# Patient Record
Sex: Female | Born: 1970 | Race: White | Hispanic: No | Marital: Married | Smoking: Never smoker
Health system: Southern US, Community
[De-identification: ages and names within clinical notes are randomized; demographics above are authoritative.]

## PROBLEM LIST (undated history)

## (undated) DIAGNOSIS — N841 Polyp of cervix uteri: Secondary | ICD-10-CM

## (undated) DIAGNOSIS — B019 Varicella without complication: Secondary | ICD-10-CM

## (undated) DIAGNOSIS — F419 Anxiety disorder, unspecified: Secondary | ICD-10-CM

## (undated) DIAGNOSIS — F32A Depression, unspecified: Secondary | ICD-10-CM

## (undated) HISTORY — DX: Depression, unspecified: F32.A

## (undated) HISTORY — PX: NASAL SEPTUM SURGERY: SHX37

## (undated) HISTORY — DX: Polyp of cervix uteri: N84.1

## (undated) HISTORY — DX: Anxiety disorder, unspecified: F41.9

## (undated) HISTORY — PX: BREAST ENHANCEMENT SURGERY: SHX7

## (undated) HISTORY — DX: Varicella without complication: B01.9

## (undated) HISTORY — PX: PLACEMENT OF BREAST IMPLANTS: SHX6334

---

## 1999-02-06 ENCOUNTER — Emergency Department (HOSPITAL_COMMUNITY): Admission: EM | Admit: 1999-02-06 | Discharge: 1999-02-06 | Payer: Self-pay | Admitting: Emergency Medicine

## 1999-02-07 ENCOUNTER — Encounter: Payer: Self-pay | Admitting: Emergency Medicine

## 1999-02-07 ENCOUNTER — Ambulatory Visit (HOSPITAL_COMMUNITY): Admission: RE | Admit: 1999-02-07 | Discharge: 1999-02-07 | Payer: Self-pay | Admitting: Emergency Medicine

## 1999-05-31 ENCOUNTER — Other Ambulatory Visit: Admission: RE | Admit: 1999-05-31 | Discharge: 1999-05-31 | Payer: Self-pay | Admitting: Internal Medicine

## 2000-06-06 ENCOUNTER — Other Ambulatory Visit: Admission: RE | Admit: 2000-06-06 | Discharge: 2000-06-06 | Payer: Self-pay | Admitting: Family Medicine

## 2002-09-19 ENCOUNTER — Encounter: Admission: RE | Admit: 2002-09-19 | Discharge: 2002-09-19 | Payer: Self-pay | Admitting: Family Medicine

## 2002-09-19 ENCOUNTER — Encounter: Payer: Self-pay | Admitting: Family Medicine

## 2003-08-27 ENCOUNTER — Other Ambulatory Visit: Admission: RE | Admit: 2003-08-27 | Discharge: 2003-08-27 | Payer: Self-pay | Admitting: Family Medicine

## 2004-10-11 ENCOUNTER — Ambulatory Visit: Payer: Self-pay | Admitting: Family Medicine

## 2004-10-11 ENCOUNTER — Other Ambulatory Visit: Admission: RE | Admit: 2004-10-11 | Discharge: 2004-10-11 | Payer: Self-pay | Admitting: Family Medicine

## 2006-09-10 ENCOUNTER — Ambulatory Visit (HOSPITAL_COMMUNITY): Admission: RE | Admit: 2006-09-10 | Discharge: 2006-09-10 | Payer: Self-pay | Admitting: Obstetrics and Gynecology

## 2006-10-10 ENCOUNTER — Ambulatory Visit (HOSPITAL_COMMUNITY): Admission: RE | Admit: 2006-10-10 | Discharge: 2006-10-10 | Payer: Self-pay | Admitting: Obstetrics and Gynecology

## 2007-08-02 ENCOUNTER — Ambulatory Visit: Payer: Self-pay | Admitting: Internal Medicine

## 2007-08-02 DIAGNOSIS — Z8639 Personal history of other endocrine, nutritional and metabolic disease: Secondary | ICD-10-CM

## 2007-08-02 DIAGNOSIS — Z978 Presence of other specified devices: Secondary | ICD-10-CM

## 2007-08-02 DIAGNOSIS — Z862 Personal history of diseases of the blood and blood-forming organs and certain disorders involving the immune mechanism: Secondary | ICD-10-CM

## 2007-08-02 HISTORY — DX: Personal history of diseases of the blood and blood-forming organs and certain disorders involving the immune mechanism: Z86.2

## 2007-08-02 HISTORY — DX: Personal history of diseases of the blood and blood-forming organs and certain disorders involving the immune mechanism: Z86.39

## 2007-08-02 HISTORY — DX: Presence of other specified devices: Z97.8

## 2007-08-05 ENCOUNTER — Encounter (INDEPENDENT_AMBULATORY_CARE_PROVIDER_SITE_OTHER): Payer: Self-pay | Admitting: *Deleted

## 2007-08-29 ENCOUNTER — Ambulatory Visit: Payer: Self-pay | Admitting: Otolaryngology

## 2010-12-03 ENCOUNTER — Encounter: Payer: Self-pay | Admitting: Obstetrics and Gynecology

## 2016-11-03 ENCOUNTER — Ambulatory Visit (INDEPENDENT_AMBULATORY_CARE_PROVIDER_SITE_OTHER): Payer: BLUE CROSS/BLUE SHIELD | Admitting: Family Medicine

## 2016-11-03 ENCOUNTER — Encounter: Payer: Self-pay | Admitting: Family Medicine

## 2016-11-03 DIAGNOSIS — M25551 Pain in right hip: Secondary | ICD-10-CM | POA: Diagnosis not present

## 2016-11-03 NOTE — Patient Instructions (Signed)
Your exam is reassuring. There is not evidence of a stress fracture, labral tear of your hip, early arthritis, impingement.  This also does not appear to be referred pain from your back. You have weakness of your right hip flexor, abductors, and external rotators along with mild proximal IT band syndrome. I would recommend starting physical therapy and doing home exercises/stretches on days you don't go to therapy. Ice/heat if needed. Ibuprofen or aleve as needed but I wouldn't expect this to make you better faster. Cross training would be ideal for the next 2-4 weeks (cycling, swimming especially) while you focus on the rehab. Follow up with me in 5-6 weeks for reevaluation. Would consider imaging if not improving as expected.

## 2016-11-12 DIAGNOSIS — M25551 Pain in right hip: Secondary | ICD-10-CM

## 2016-11-12 HISTORY — DX: Pain in right hip: M25.551

## 2016-11-12 NOTE — Assessment & Plan Note (Signed)
history and exam are reassuring that she does not have a stress fracture or intraarticular pathology.  Combination of hip flexor, abductor, external rotator weakness and proximal IT band syndrome.  Start physical therapy, home exercises, ice/heat.  Ibuprofen or aleve.  Cross train the next 2-4 weeks then ease back into running.  F/u in 5-6 weeks.

## 2016-11-12 NOTE — Progress Notes (Signed)
PCP: Jamie OraJose Paz, MD  Subjective:   HPI: Patient is a 45 y.o. female here for right hip pain.  Patient reports for about 4 months she's had pain in right hip.  No acute injury or trauma. She was running 12-15 miles a week leading up to when this started. Pain is 4-5/10 and seems worse after running and with walking. Improves with running. Tried ice/heat, modifying workouts. Also rested from running for a month. Feels a tightness anterior and lateral right hip. Gets a burning sensation in hip flexor area. No bowel/bladder dysfunction. No numbness or skin changes.  No past medical history on file.  No current outpatient prescriptions on file prior to visit.   No current facility-administered medications on file prior to visit.     No past surgical history on file.  No Known Allergies  Social History   Social History  . Marital status: Married    Spouse name: N/A  . Number of children: N/A  . Years of education: N/A   Occupational History  . Not on file.   Social History Main Topics  . Smoking status: Never Smoker  . Smokeless tobacco: Never Used  . Alcohol use Not on file  . Drug use: Unknown  . Sexual activity: Not on file   Other Topics Concern  . Not on file   Social History Narrative  . No narrative on file    No family history on file.  BP 112/80   Pulse 74   Ht 5\' 3"  (1.6 m)   Wt 110 lb (49.9 kg)   BMI 19.49 kg/m   Review of Systems: See HPI above.     Objective:  Physical Exam:  Gen: NAD, comfortable in exam room  Right hip: No gross deformity, scoliosis. Mild TTP proximal IT band.  No midline or bony TTP. FROM with 5-/5 with hip flexion, abduction. Negative SLRs. Sensation intact to light touch bilaterally. Negative logroll bilateral hips Negative fabers and piriformis stretches. Negative hop and fulcrum tests. Positive ober's  Assessment & Plan:  1. Right hip pain - history and exam are reassuring that she does not have a stress  fracture or intraarticular pathology.  Combination of hip flexor, abductor, external rotator weakness and proximal IT band syndrome.  Start physical therapy, home exercises, ice/heat.  Ibuprofen or aleve.  Cross train the next 2-4 weeks then ease back into running.  F/u in 5-6 weeks.

## 2017-07-19 DIAGNOSIS — Z01419 Encounter for gynecological examination (general) (routine) without abnormal findings: Secondary | ICD-10-CM | POA: Diagnosis not present

## 2017-08-07 DIAGNOSIS — Z0000 Encounter for general adult medical examination without abnormal findings: Secondary | ICD-10-CM | POA: Diagnosis not present

## 2017-09-05 DIAGNOSIS — Z23 Encounter for immunization: Secondary | ICD-10-CM | POA: Diagnosis not present

## 2018-03-14 DIAGNOSIS — Z1231 Encounter for screening mammogram for malignant neoplasm of breast: Secondary | ICD-10-CM | POA: Diagnosis not present

## 2019-10-10 DIAGNOSIS — Z20828 Contact with and (suspected) exposure to other viral communicable diseases: Secondary | ICD-10-CM | POA: Diagnosis not present

## 2019-11-05 DIAGNOSIS — Z1239 Encounter for other screening for malignant neoplasm of breast: Secondary | ICD-10-CM | POA: Diagnosis not present

## 2019-11-05 DIAGNOSIS — Z803 Family history of malignant neoplasm of breast: Secondary | ICD-10-CM | POA: Diagnosis not present

## 2019-11-05 DIAGNOSIS — Z1231 Encounter for screening mammogram for malignant neoplasm of breast: Secondary | ICD-10-CM | POA: Diagnosis not present

## 2019-11-14 DIAGNOSIS — E559 Vitamin D deficiency, unspecified: Secondary | ICD-10-CM | POA: Insufficient documentation

## 2019-11-14 HISTORY — DX: Vitamin D deficiency, unspecified: E55.9

## 2019-11-18 DIAGNOSIS — Z1151 Encounter for screening for human papillomavirus (HPV): Secondary | ICD-10-CM | POA: Diagnosis not present

## 2019-11-18 DIAGNOSIS — Z01419 Encounter for gynecological examination (general) (routine) without abnormal findings: Secondary | ICD-10-CM | POA: Diagnosis not present

## 2019-11-18 LAB — HM PAP SMEAR

## 2019-11-18 LAB — RESULTS CONSOLE HPV: CHL HPV: NEGATIVE

## 2019-12-02 DIAGNOSIS — Z0000 Encounter for general adult medical examination without abnormal findings: Secondary | ICD-10-CM | POA: Diagnosis not present

## 2020-01-03 DIAGNOSIS — Z20828 Contact with and (suspected) exposure to other viral communicable diseases: Secondary | ICD-10-CM | POA: Diagnosis not present

## 2020-08-03 ENCOUNTER — Ambulatory Visit: Payer: BLUE CROSS/BLUE SHIELD | Admitting: Family Medicine

## 2020-08-03 ENCOUNTER — Encounter: Payer: Self-pay | Admitting: Family Medicine

## 2020-08-03 ENCOUNTER — Other Ambulatory Visit: Payer: Self-pay

## 2020-08-03 VITALS — BP 107/70 | HR 92 | Temp 98.2°F | Ht 62.5 in | Wt 110.0 lb

## 2020-08-03 DIAGNOSIS — R079 Chest pain, unspecified: Secondary | ICD-10-CM

## 2020-08-03 DIAGNOSIS — F419 Anxiety disorder, unspecified: Secondary | ICD-10-CM

## 2020-08-03 DIAGNOSIS — Z7689 Persons encountering health services in other specified circumstances: Secondary | ICD-10-CM

## 2020-08-03 MED ORDER — SERTRALINE HCL 25 MG PO TABS: 25.0000 mg | ORAL_TABLET | Freq: Every day | ORAL | 1 refills | Status: DC

## 2020-08-03 NOTE — Patient Instructions (Signed)
Start zoloft 25 mg a day to help with stress.  Your ekg was normal.   We will call you with lab results once we get them.  I have referred you to cardiology as well.     Nonspecific Chest Pain Chest pain can be caused by many different conditions. Some causes of chest pain can be life-threatening. These will require treatment right away. Serious causes of chest pain include:  Heart attack.  A tear in the body's main blood vessel.  Redness and swelling (inflammation) around your heart.  Blood clot in your lungs. Other causes of chest pain may not be so serious. These include:  Heartburn.  Anxiety or stress.  Damage to bones or muscles in your chest.  Lung infections. Chest pain can feel like:  Pain or discomfort in your chest.  Crushing, pressure, aching, or squeezing pain.  Burning or tingling.  Dull or sharp pain that is worse when you move, cough, or take a deep breath.  Pain or discomfort that is also felt in your back, neck, jaw, shoulder, or arm, or pain that spreads to any of these areas. It is hard to know whether your pain is caused by something that is serious or something that is not so serious. So it is important to see your doctor right away if you have chest pain. Follow these instructions at home: Medicines  Take over-the-counter and prescription medicines only as told by your doctor.  If you were prescribed an antibiotic medicine, take it as told by your doctor. Do not stop taking the antibiotic even if you start to feel better. Lifestyle   Rest as told by your doctor.  Do not use any products that contain nicotine or tobacco, such as cigarettes, e-cigarettes, and chewing tobacco. If you need help quitting, ask your doctor.  Do not drink alcohol.  Make lifestyle changes as told by your doctor. These may include: ? Getting regular exercise. Ask your doctor what activities are safe for you. ? Eating a heart-healthy diet. A diet and nutrition  specialist (dietitian) can help you to learn healthy eating options. ? Staying at a healthy weight. ? Treating diabetes or high blood pressure, if needed. ? Lowering your stress. Activities such as yoga and relaxation techniques can help. General instructions  Pay attention to any changes in your symptoms. Tell your doctor about them or any new symptoms.  Avoid any activities that cause chest pain.  Keep all follow-up visits as told by your doctor. This is important. You may need more testing if your chest pain does not go away. Contact a doctor if:  Your chest pain does not go away.  You feel depressed.  You have a fever. Get help right away if:  Your chest pain is worse.  You have a cough that gets worse, or you cough up blood.  You have very bad (severe) pain in your belly (abdomen).  You pass out (faint).  You have either of these for no clear reason: ? Sudden chest discomfort. ? Sudden discomfort in your arms, back, neck, or jaw.  You have shortness of breath at any time.  You suddenly start to sweat, or your skin gets clammy.  You feel sick to your stomach (nauseous).  You throw up (vomit).  You suddenly feel lightheaded or dizzy.  You feel very weak or tired.  Your heart starts to beat fast, or it feels like it is skipping beats. These symptoms may be an emergency. Do not wait  to see if the symptoms will go away. Get medical help right away. Call your local emergency services (911 in the [place] Do not drive yourself to the hospital. Summary  Chest pain can be caused by many different conditions. The cause may be serious and need treatment right away. If you have chest pain, see your doctor right away.  Follow your doctor's instructions for taking medicines and making lifestyle changes.  Keep all follow-up visits as told by your doctor. This includes visits for any further testing if your chest pain does not go away.  Be sure to know the signs that show  that your condition has become worse. Get help right away if you have these symptoms. This information is not intended to replace advice given to you by your health care provider. Make sure you discuss any questions you have with your health care provider. Document Revised: 05/02/2018 Document Reviewed: 05/02/2018 Elsevier Patient Education  2020 [employer].

## 2020-08-03 NOTE — Progress Notes (Signed)
Patient ID: Jamie Bauer, female  DOB: 1971-03-08, 49 y.o.   MRN: [id number] Patient Care Team    Relationship Specialty Notifications Start End  Ma Hillock, DO PCP - General Family Medicine  08/03/20   Curt Jews, [place]  Optometry  08/05/20   Judie Bonus, MD  Obstetrics and Gynecology  08/05/20     Chief Complaint  Patient presents with  . Establish Care  . Chest Pain    pt c/o sharp chest pain that radiates to back x 2 mos; has been have different stressor occuring   . Anxiety    PMH of MDD and GAD has taken Zoloft in past reoccur in the last year   . Health Maintenance    declines flu and COVID    Subjective:  Jamie Bauer is a 49 y.o.  female present for new patient establishment. All past medical history, surgical history, allergies, family history, immunizations, medications and social history were updated in the electronic medical record today. All recent labs, ED visits and hospitalizations within the last year were reviewed.  Patient presents today for new patient establishment with complaints of 2 months duration of chest discomfort.  She describes the discomfort as a sharp chest pain that radiated from her anterior chest to her posterior chest.  She reports this particular pain has occurred twice and only last a few minutes.  Both occurrences occurred while she was driving.  She reports she did have breakfast prior to each of those occurrences.  Nothing out of the ordinary she eats a routine diet.  She reports she was not anxious at the time while she was driving.  She states since that time she also has had a dull ache that she describes as being on the left side of her chest that occurs typically when she wakes up, but can be present all throughout her day.  She also notices when she is running her chest becomes sore on the left side.  She has considered muscle strain or her breast implant complications, however she states the pain is deeper than her chest  wall.  Has had a history of anxiety and has been prescribed low-dose Paxil and Zoloft in the past.  She reports she thinks it may be anxiety that is causing her symptoms.  She denies any history of GERD.  She denies pain with deep breaths.  She is concerned it is cardiac in nature.   Depression screen [facility] 2/9 08/03/2020  Decreased Interest 1  Down, Depressed, Hopeless 0  PHQ - 2 Score 1  Altered sleeping 1  Tired, decreased energy 1  Change in appetite 0  Feeling bad or failure about yourself  1  Trouble concentrating 0  Moving slowly or fidgety/restless 0  Suicidal thoughts 0  PHQ-9 Score 4   GAD 7 : Generalized Anxiety Score 08/03/2020  Nervous, Anxious, on Edge 1  Control/stop worrying 1  Worry too much - different things 1  Trouble relaxing 0  Restless 0  Easily annoyed or irritable 1  Afraid - awful might happen 1  Total GAD 7 Score 5       No flowsheet data found.   Immunization History  Administered Date(s) Administered  . Td 09/13/2004  . Tdap 04/08/2015    No exam data present  Past Medical History:  Diagnosis Date  . Anxiety   . Chicken pox   . Depression   . Endocervical polyp   . Vitamin D deficiency 11/2019  No Known Allergies Past Surgical History:  Procedure Laterality Date  . BREAST ENHANCEMENT SURGERY     age 33  . CESAREAN SECTION  2003   2003 and 2008  . SEPTOPLASTY  2008   Family History  Problem Relation Age of Onset  . Depression Mother   . Anxiety disorder Mother   . Hyperlipidemia Mother   . Hypertension Mother   . Alcohol abuse Father   . Hyperlipidemia Father   . Hypertension Father   . Breast cancer Maternal Aunt 30  . Breast cancer Maternal Aunt 70   Social History   Social History Narrative   Marital status/children/pets: Married with 2 children   Education/employment: Bachelor's degree.  Works as an Glass blower/designer.   Safety:      -smoke alarm in the home:Yes     - wears seatbelt: Yes     - Feels safe in their  relationships: Yes    Allergies as of 08/03/2020   No Known Allergies     Medication List       Accurate as of August 03, 2020 11:59 PM. If you have any questions, ask your nurse or doctor.        MAGNEBIND 400 PO Take by mouth.   sertraline 25 MG tablet Commonly known as: Zoloft Take 1 tablet (25 mg total) by mouth daily. Started by: Howard Pouch, DO   VITAMIN C PO Take 2,500 mg by mouth.   Vitamin D 50 MCG (2000 UT) Caps Take by mouth.   Zinc 15 MG Caps Take by mouth.       All past medical history, surgical history, allergies, family history, immunizations andmedications were updated in the EMR today and reviewed under the history and medication portions of their EMR.    Recent Results (from the past 2160 hour(s))  CBC     Status: None   Collection Time: 08/03/20  1:58 PM  Result Value Ref Range   WBC 7.5 4.0 - 10.5 K/uL   RBC 3.93 3.87 - 5.11 Mil/uL   Platelets 250.0 150 - 400 K/uL   Hemoglobin 12.6 12.0 - 15.0 g/dL   HCT 37.9 36 - 46 %   MCV 96.3 78.0 - 100.0 fl   MCHC 33.3 30.0 - 36.0 g/dL   RDW 12.3 11.5 - 15.5 %  TSH     Status: None   Collection Time: 08/03/20  1:58 PM  Result Value Ref Range   TSH 2.07 0.35 - 4.50 uIU/mL  Comp Met (CMET)     Status: None   Collection Time: 08/03/20  1:58 PM  Result Value Ref Range   Sodium 139 135 - 145 mEq/L   Potassium 3.6 3.5 - 5.1 mEq/L   Chloride 104 96 - 112 mEq/L   CO2 30 19 - 32 mEq/L   Glucose, Bld 94 70 - 99 mg/dL   BUN 17 6 - 23 mg/dL   Creatinine, Ser 0.70 0.40 - 1.20 mg/dL   Total Bilirubin 0.4 0.2 - 1.2 mg/dL   Alkaline Phosphatase 65 39 - 117 U/L   AST 12 0 - 37 U/L   ALT 8 0 - 35 U/L   Total Protein 7.0 6.0 - 8.3 g/dL   Albumin 4.3 3.5 - 5.2 g/dL   GFR 88.71 >60.00 mL/min   Calcium 9.5 8.4 - 10.5 mg/dL  Lipase     Status: None   Collection Time: 08/03/20  1:58 PM  Result Value Ref Range   Lipase 30.0 11 -  59 U/L    No results found.   ROS: 14 pt review of systems performed and  negative (unless mentioned in an HPI)  Objective: BP 107/70   Pulse 92   Temp 98.2 F (36.8 C) (Oral)   Ht 5' 2.5" (1.588 m)   Wt 110 lb (49.9 kg)   LMP 07/04/2020   SpO2 98%   BMI 19.80 kg/m  Gen: Afebrile. No acute distress. Nontoxic in appearance, well-developed, well-nourished, very pleasant thin Caucasian female. HENT: AT. [place].  MMM, no cough or hoarseness on exam Eyes:Pupils Equal Round Reactive to light, Extraocular movements intact,  Conjunctiva without redness, discharge or icterus. Neck/lymp/endocrine: Supple, no lymphadenopathy, no thyromegaly CV: RRR no murmur, no edema Chest: CTAB, no wheeze, rhonchi or crackles.  Normal respiratory effort.  Good air movement.  No chest wall tenderness to palpation Abd: Soft.NTND. BS present.  No masses palpated. No hepatosplenomegaly. No rebound tenderness or guarding. Skin:Warm and well-perfused. Skin intact. Neuro/Msk:Normal gait. PERLA. EOMi. Alert. Oriented x3.  Chest wall tenderness to palpation.  Normal range of motion of bilateral upper extremities without reproduction of symptoms.  Muscle strength 5/5 bilateral upper extremities without reproduction with resistance.  Neurovascularly intact distally.   Psych: Normal affect, dress and demeanor. Normal speech. Normal thought content and judgment. EKG: NSR.  HR 72, PR 176, QTc 414.  Assessment/plan: Jamie Bauer is a 49 y.o. female present for  est care Chest pain, unspecified type Lengthy discussion today surrounding patient's atypical chest discomfort.  Anxiety is likely playing a role in at least some of her symptoms, however cannot rule out cardiac etiology with her having some symptoms with running. Discussed referral to cardiology and she is agreeable to this today. Collected today to rule out anemia, thyroid disorder, electrolyte/liver causes and lipase.  EKG normal.  Chest x-ray ordered. - EKG 12-Lead> NSR - CBC - TSH - Comp Met (CMET) - Lipase - Ambulatory referral to  Cardiology - DG Chest 2 View; Future -If laboratory evaluation, chest x-ray and cardiology does not reveal cause of her discomfort and controlling anxiety does not improve her symptoms, would encourage close follow-up to further investigate with possible CT chest/abdomen.  Anxiety: Discussed options with her today and she is agreeable to start low-dose Zoloft. Her Zoloft 25 mg daily.  She has been on this medication before known to tolerate.  Therefore follow-up in 5.5 months as long as symptoms are well controlled, or 6 weeks if not adequately covered  Return in about 23 weeks (around 01/11/2021).  Orders Placed This Encounter  Procedures  . CBC  . TSH  . Comp Met (CMET)  . Lipase  . Ambulatory referral to Cardiology  . EKG 12-Lead   Meds ordered this encounter  Medications  . sertraline (ZOLOFT) 25 MG tablet    Sig: Take 1 tablet (25 mg total) by mouth daily.    Dispense:  90 tablet    Refill:  1    Referral Orders     Ambulatory referral to Cardiology  > 60 minutes was dedicated to this patient's encounter to include pre-visit review of chart, face-to-face time with patient and post-visit work- which include documentation and prescribing medications and/or ordering test when necessary.    Note is dictated utilizing voice recognition software. Although note has been proof read prior to signing, occasional typographical errors still can be missed. If any questions arise, please do not hesitate to call for verification.  Electronically signed by: Howard Pouch, DO [place]

## 2020-08-04 LAB — COMPREHENSIVE METABOLIC PANEL
ALT: 8 U/L (ref 0–35)
AST: 12 U/L (ref 0–37)
Albumin: 4.3 g/dL (ref 3.5–5.2)
Alkaline Phosphatase: 65 U/L (ref 39–117)
BUN: 17 mg/dL (ref 6–23)
CO2: 30 mEq/L (ref 19–32)
Calcium: 9.5 mg/dL (ref 8.4–10.5)
Chloride: 104 mEq/L (ref 96–112)
Creatinine, Ser: 0.7 mg/dL (ref 0.40–1.20)
GFR: 88.71 mL/min (ref >60.00)
Glucose, Bld: 94 mg/dL (ref 70–99)
Potassium: 3.6 mEq/L (ref 3.5–5.1)
Sodium: 139 mEq/L (ref 135–145)
Total Bilirubin: 0.4 mg/dL (ref 0.2–1.2)
Total Protein: 7 g/dL (ref 6.0–8.3)

## 2020-08-04 LAB — CBC
HCT: 37.9 % (ref 36.0–46.0)
Hemoglobin: 12.6 g/dL (ref 12.0–15.0)
MCHC: 33.3 g/dL (ref 30.0–36.0)
MCV: 96.3 fl (ref 78.0–100.0)
Platelets: 250 10*3/uL (ref 150.0–400.0)
RBC: 3.93 Mil/uL (ref 3.87–5.11)
RDW: 12.3 % (ref 11.5–15.5)
WBC: 7.5 10*3/uL (ref 4.0–10.5)

## 2020-08-04 LAB — TSH: TSH: 2.07 u[IU]/mL (ref 0.35–4.50)

## 2020-08-04 LAB — LIPASE: Lipase: 30 U/L (ref 11.0–59.0)

## 2020-08-05 ENCOUNTER — Telehealth: Payer: Self-pay | Admitting: Family Medicine

## 2020-08-05 ENCOUNTER — Encounter: Payer: Self-pay | Admitting: Family Medicine

## 2020-08-05 DIAGNOSIS — H5213 Myopia, bilateral: Secondary | ICD-10-CM | POA: Diagnosis not present

## 2020-08-05 DIAGNOSIS — F419 Anxiety disorder, unspecified: Secondary | ICD-10-CM | POA: Insufficient documentation

## 2020-08-05 DIAGNOSIS — R079 Chest pain, unspecified: Secondary | ICD-10-CM

## 2020-08-05 HISTORY — DX: Chest pain, unspecified: R07.9

## 2020-08-05 NOTE — Telephone Encounter (Signed)
Please call patient: In further reviewing her chart-  I placed an order for a chest x-ray to further evaluate her chest discomfort complaints.  She can have this completed at [facility] during their operational hours.  She does not need a set appointment for this she just presents to the med [facility] front desk in they will check her and take her back to the radiology department.  Soon as we receive those results we will call her with them.  I also did place the cardiology referral we spoke about.  She should be receiving a call from them shortly to get her scheduled.  Lastly, if she is not seeing improvement in her chest discomfort with better anxiety control on Zoloft, or if chest discomfort is worsening I would advise her to follow-up with [place] for further evaluation as well to further evaluate pulmonology and muscle skeletal causes (including her implant).   Thanks

## 2020-08-06 ENCOUNTER — Ambulatory Visit (HOSPITAL_BASED_OUTPATIENT_CLINIC_OR_DEPARTMENT_OTHER)
Admission: RE | Admit: 2020-08-06 | Discharge: 2020-08-06 | Disposition: A | Discharge status: Home / Self Care | Payer: BC Managed Care – PPO | Source: Ambulatory Visit | Attending: Family Medicine | Admitting: Family Medicine

## 2020-08-06 ENCOUNTER — Other Ambulatory Visit: Payer: Self-pay

## 2020-08-06 DIAGNOSIS — R079 Chest pain, unspecified: Secondary | ICD-10-CM | POA: Diagnosis not present

## 2020-08-06 NOTE — Telephone Encounter (Signed)
Spoke with pt regarding following instructions and verbalized understanding.

## 2020-08-10 ENCOUNTER — Telehealth: Payer: Self-pay

## 2020-08-10 NOTE — Telephone Encounter (Signed)
Patient recently started on Zoloft and is reporting side effects.  If she is having side effects and I would encourage her not to continue the medication.  If she interested in trying a different medication?

## 2020-08-10 NOTE — Telephone Encounter (Signed)
Pt informed me that she has been experiencing some adverse effects such as worsening of anxiety, loss of sleep, and elevation of BP. Pt did not take medication last night and started on Wed.

## 2020-08-10 NOTE — Telephone Encounter (Signed)
Pt declines another rx. Pt will try to readjust her environment.

## 2020-08-27 DIAGNOSIS — B019 Varicella without complication: Secondary | ICD-10-CM | POA: Insufficient documentation

## 2020-08-27 DIAGNOSIS — N841 Polyp of cervix uteri: Secondary | ICD-10-CM | POA: Insufficient documentation

## 2020-08-27 DIAGNOSIS — F32A Depression, unspecified: Secondary | ICD-10-CM | POA: Insufficient documentation

## 2020-08-30 ENCOUNTER — Ambulatory Visit (INDEPENDENT_AMBULATORY_CARE_PROVIDER_SITE_OTHER): Payer: BC Managed Care – PPO | Admitting: Cardiology

## 2020-08-30 ENCOUNTER — Other Ambulatory Visit: Payer: Self-pay

## 2020-08-30 ENCOUNTER — Encounter: Payer: Self-pay | Admitting: Cardiology

## 2020-08-30 DIAGNOSIS — R011 Cardiac murmur, unspecified: Secondary | ICD-10-CM

## 2020-08-30 DIAGNOSIS — Z13220 Encounter for screening for lipoid disorders: Secondary | ICD-10-CM | POA: Diagnosis not present

## 2020-08-30 DIAGNOSIS — R072 Precordial pain: Secondary | ICD-10-CM | POA: Diagnosis not present

## 2020-08-30 DIAGNOSIS — R0789 Other chest pain: Secondary | ICD-10-CM | POA: Insufficient documentation

## 2020-08-30 HISTORY — DX: Other chest pain: R07.89

## 2020-08-30 NOTE — Progress Notes (Signed)
Cardiology Office Note:    Date:  08/30/2020   ID:  Jamie Bauer, DOB Jul 31, 1971, MRN [id number]  PCP:  Jamie Leatherwood, DO  Cardiologist:  Garwin Brothers, Bauer   Referring Bauer: Jamie Leatherwood, DO    ASSESSMENT:    1. Chest discomfort   2. Cardiac murmur   3. Screening for cholesterol level    PLAN:    In order of problems listed above:  1. Chest discomfort: Primary prevention stressed with the patient.  Importance of compliance with diet medication stressed and she vocalized understanding.  She is very worried about the symptoms.  It prevents her from exercising to the full extent.  For this reason I will do Bauer Lexiscan sestamibi to assess for any obstructive evidence of coronary artery disease. 2. Cardiac murmur: Echocardiogram will be done to assess murmur heard on auscultation. 3. Risk stratification: I will do Bauer cholesterol level.  There is not one in the record so to help her risk stratify I am going to obtain this and she will come back on another day she is not fasting. 4. Patient will be seen in follow-up appointment in 6 months or earlier if the patient has any concerns    Medication Adjustments/Labs and Tests Ordered: Current medicines are reviewed at length with the patient today.  Concerns regarding medicines are outlined above.  No orders of the defined types were placed in this encounter.  No orders of the defined types were placed in this encounter.    History of Present Illness:    Jamie Bauer is Bauer 49 y.o. female who is being seen today for the evaluation of chest discomfort at the request of Kuneff, Renee A, DO.  Patient is Bauer pleasant 49 year old female.  She has no significant past medical history.  She is an active lady.  She tells me that she exercises very regularly however she could run about 3 miles.  Now in between she gets fatigued and has to stop and restart.  She has chest discomfort.  This may or may not related to exertion she is concerned and  apprehensive about it.  She is anxious about these issues.  Therefore she is sent here for evaluation.  At the time of my evaluation, the patient is alert awake oriented and in no distress.  Past Medical History:  Diagnosis Date  . Anxiety   . BREAST IMPLANTS, BILATERAL, HX OF 08/02/2007   Qualifier: Diagnosis of  By: Jamie Bauer, Jamie E.   . Chest pain 08/05/2020  . Chicken pox   . Depression   . Endocervical polyp   . Right hip pain 11/12/2016  . THYROID NODULE, HX OF 08/02/2007   Qualifier: Diagnosis of  By: Jamie Bauer, Jamie Bauer Vitamin D deficiency 11/2019    Past Surgical History:  Procedure Laterality Date  . BREAST ENHANCEMENT SURGERY     age 6  . CESAREAN SECTION  2003   2003 and 2008  . SEPTOPLASTY  2008    Current Medications: Current Meds  Medication Sig  . Ascorbic Acid (VITAMIN C PO) Take 2,500 mg by mouth.  . Calcium Carb-Magnesium Carb (MAGNEBIND 400 PO) Take by mouth.  . Cholecalciferol (VITAMIN D) 50 MCG (2000 UT) CAPS Take 2,000 Units by mouth daily.   . Selenium 200 MCG CAPS Take 200 mcg by mouth daily.     Allergies:   Patient has no known allergies.   Social History   Socioeconomic History  .  Marital status: Married    Spouse name: Not on file  . Number of children: Not on file  . Years of education: Not on file  . Highest education level: Not on file  Occupational History  . Not on file  Tobacco Use  . Smoking status: Never Smoker  . Smokeless tobacco: Never Used  Vaping Use  . Vaping Use: Never used  Substance and Sexual Activity  . Alcohol use: Yes    Alcohol/week: 1.0 standard drink    Types: 1 Glasses of wine per week  . Drug use: Never  . Sexual activity: Yes    Partners: Male    Birth control/protection: Other-see comments    Comment: vasectomy  Other Topics Concern  . Not on file  Social History Narrative   Marital status/children/pets: Married with 2 children   Education/employment: Bachelor's degree.  Works as an Print production planner.     Safety:      -smoke alarm in the home:Yes     - wears seatbelt: Yes     - Feels safe in their relationships: Yes   Social Determinants of Health   Financial Resource Strain:   . Difficulty of Paying Living Expenses: Not on file  Food Insecurity:   . Worried About Programme researcher, broadcasting/film/video in the Last Year: Not on file  . Ran Out of Food in the Last Year: Not on file  Transportation Needs:   . Lack of Transportation (Medical): Not on file  . Lack of Transportation (Non-Medical): Not on file  Physical Activity:   . Days of Exercise per Week: Not on file  . Minutes of Exercise per Session: Not on file  Stress:   . Feeling of Stress : Not on file  Social Connections:   . Frequency of Communication with Friends and Family: Not on file  . Frequency of Social Gatherings with Friends and Family: Not on file  . Attends Religious Services: Not on file  . Active Member of Clubs or Organizations: Not on file  . Attends Banker Meetings: Not on file  . Marital Status: Not on file     Family History: The patient's family history includes Alcohol abuse in her father; Anxiety disorder in her mother; Breast cancer (age of onset: 52) in her maternal aunt; Breast cancer (age of onset: 57) in her maternal aunt; Depression in her mother; Hyperlipidemia in her father and mother; Hypertension in her father and mother.  ROS:   Please see the history of present illness.    All other systems reviewed and are negative.  EKGs/Labs/Other Studies Reviewed:    The following studies were reviewed today: EKG reveals sinus rhythm and nonspecific ST-T changes   Recent Labs: 08/03/2020: ALT 8; BUN 17; Creatinine, Ser 0.70; Hemoglobin 12.6; Platelets 250.0; Potassium 3.6; Sodium 139; TSH 2.07  Recent Lipid Panel No results found for: CHOL, TRIG, HDL, CHOLHDL, VLDL, LDLCALC, LDLDIRECT  Physical Exam:    VS:  BP 106/76   Pulse 81   Ht 5\' 2"  (1.575 m)   Wt 111 lb 0.6 oz (50.4 kg)   SpO2 99%    BMI 20.31 kg/m     Wt Readings from Last 3 Encounters:  08/30/20 111 lb 0.6 oz (50.4 kg)  08/03/20 110 lb (49.9 kg)  11/03/16 110 lb (49.9 kg)     GEN: Patient is in no acute distress HEENT: Normal NECK: No JVD; No carotid bruits LYMPHATICS: No lymphadenopathy CARDIAC: S1 S2 regular, 2/6 systolic murmur at  the apex. RESPIRATORY:  Clear to auscultation without rales, wheezing or rhonchi  ABDOMEN: Soft, non-tender, non-distended MUSCULOSKELETAL:  No edema; No deformity  SKIN: Warm and dry NEUROLOGIC:  Alert and oriented x 3 PSYCHIATRIC:  Normal affect    Signed, Garwin Brothers, Bauer  08/30/2020 9:09 AM    [facility]

## 2020-08-30 NOTE — Patient Instructions (Addendum)
Medication Instructions:  No medication changes. *If you need a refill on your cardiac medications before your next appointment, please call your pharmacy*   Lab Work: Your physician recommends that you return for lab work in: the next few days. You need to have labs done when you are fasting.  You can come Monday through Friday 8:30 am to 12:00 pm and 1:15 to 4:30. You do not need to make an appointment as the order has already been placed. The labs you are going to have done are LFT and Lipids.  If you have labs (blood work) drawn today and your tests are completely normal, you will receive your results only by: Marland Kitchen. MyChart Message (if you have MyChart) OR . A paper copy in the mail If you have any lab test that is abnormal or we need to change your treatment, we will call you to review the results.   Testing/Procedures: Your physician has requested that you have an echocardiogram. Echocardiography is a painless test that uses sound waves to create images of your heart. It provides your doctor with information about the size and shape of your heart and how well your heart's chambers and valves are working. This procedure takes approximately one hour. There are no restrictions for this procedure.  Your physician has requested that you have a lexiscan myoview. For further information please visit [email or web address]. Please follow instruction sheet, as given.  The test will take approximately 3 to 4 hours to complete; you may bring reading material.  If someone comes with you to your appointment, they will need to remain in the main lobby due to limited space in the testing area. **If you are pregnant or breastfeeding, please notify the nuclear lab prior to your appointment**  How to prepare for your Myocardial Perfusion Test: . Do not eat or drink 3 hours prior to your test, except you may have water. . Do not consume products containing caffeine (regular or decaffeinated) 12 hours prior to  your test. (ex: coffee, chocolate, sodas, tea). . Do bring a list of your current medications with you.  If not listed below, you may take your medications as normal. . Do wear comfortable clothes (no dresses or overalls) and walking shoes, tennis shoes preferred (No heels or open toe shoes are allowed). . Do NOT wear cologne, perfume, aftershave, or lotions (deodorant is allowed). . If these instructions are not followed, your test will have to be rescheduled.    Follow-Up: At [facility], you and your health needs are our priority.  As part of our continuing mission to provide you with exceptional heart care, we have created designated Provider Care Teams.  These Care Teams include your primary Cardiologist (physician) and Advanced Practice Providers (APPs -  Physician Assistants and Nurse Practitioners) who all work together to provide you with the care you need, when you need it.  We recommend signing up for the patient portal called "MyChart".  Sign up information is provided on this After Visit Summary.  MyChart is used to connect with patients for Virtual Visits (Telemedicine).  Patients are able to view lab/test results, encounter notes, upcoming appointments, etc.  Non-urgent messages can be sent to your provider as well.   To learn more about what you can do with MyChart, go to [email or web address].    Your next appointment:   1 month(s)  The format for your next appointment:   In Person  Provider:   Belva Cromeajan Revankar, MD   Other Instructions  Cardiac  Nuclear Scan  A cardiac nuclear scan is a test that is done to check the flow of blood to your heart. It is done when you are resting and when you are exercising. The test looks for problems such as:  Not enough blood reaching a portion of the heart.  The heart muscle not working as it should. You may need this test if:  You have heart disease.  You have had lab results that are not normal.  You have had heart surgery  or a balloon procedure to open up blocked arteries (angioplasty).  You have chest pain.  You have shortness of breath. In this test, a special dye (tracer) is put into your bloodstream. The tracer will travel to your heart. A camera will then take pictures of your heart to see how the tracer moves through your heart. This test is usually done at a hospital and takes 2-4 hours. Tell a doctor about:  Any allergies you have.  All medicines you are taking, including vitamins, herbs, eye drops, creams, and over-the-counter medicines.  Any problems you or family members have had with anesthetic medicines.  Any blood disorders you have.  Any surgeries you have had.  Any medical conditions you have.  Whether you are pregnant or may be pregnant. What are the risks? Generally, this is a safe test. However, problems may occur, such as:  Serious chest pain and heart attack. This is only a risk if the stress portion of the test is done.  Rapid heartbeat.  A feeling of warmth in your chest. This feeling usually does not last long.  Allergic reaction to the tracer. What happens before the test?  Ask your doctor about changing or stopping your normal medicines. This is important.  Follow instructions from your doctor about what you cannot eat or drink.  Remove your jewelry on the day of the test. What happens during the test? 1. An IV tube will be inserted into one of your veins. 2. Your doctor will give you a small amount of tracer through the IV tube. 3. You will wait for 20-40 minutes while the tracer moves through your bloodstream. 4. Your heart will be monitored with an electrocardiogram (ECG). 5. You will lie down on an exam table. 6. Pictures of your heart will be taken for about 15-20 minutes. 7. You may also have a stress test. For this test, one of these things may be done: ? You will be asked to exercise on a treadmill or a stationary bike. ? You will be given medicines that  will make your heart work harder. This is done if you are unable to exercise. 8. When blood flow to your heart has peaked, a tracer will again be given through the IV tube. 9. After 20-40 minutes, you will get back on the exam table. More pictures will be taken of your heart. 10. Depending on the tracer that is used, more pictures may need to be taken 3-4 hours later. 11. Your IV tube will be removed when the test is over. The test may vary among doctors and hospitals. What happens after the test? 1. Ask your doctor: ? Whether you can return to your normal schedule, including diet, activities, and medicines. ? Whether you should drink more fluids. This will help to remove the tracer from your body. Drink enough fluid to keep your pee (urine) pale yellow. 2. Ask your doctor, or the department that is doing the test: ? When will my results  be ready? ? How will I get my results? Summary  A cardiac nuclear scan is a test that is done to check the flow of blood to your heart.  Tell your doctor whether you are pregnant or may be pregnant.  Before the test, ask your doctor about changing or stopping your normal medicines. This is important.  Ask your doctor whether you can return to your normal activities. You may be asked to drink more fluids. This information is not intended to replace advice given to you by your health care provider. Make sure you discuss any questions you have with your health care provider. Document Revised: 02/19/2019 Document Reviewed: 04/15/2018 Elsevier Patient Education  2020 [employer].  Echocardiogram An echocardiogram is a procedure that uses painless sound waves (ultrasound) to produce an image of the heart. Images from an echocardiogram can provide important information about:  Signs of coronary artery disease (CAD).  Aneurysm detection. An aneurysm is a weak or damaged part of an artery wall that bulges out from the normal force of blood pumping through the  body.  Heart size and shape. Changes in the size or shape of the heart can be associated with certain conditions, including heart failure, aneurysm, and CAD.  Heart muscle function.  Heart valve function.  Signs of a past heart attack.  Fluid buildup around the heart.  Thickening of the heart muscle.  A tumor or infectious growth around the heart valves. Tell a health care provider about:  Any allergies you have.  All medicines you are taking, including vitamins, herbs, eye drops, creams, and over-the-counter medicines.  Any blood disorders you have.  Any surgeries you have had.  Any medical conditions you have.  Whether you are pregnant or may be pregnant. What are the risks? Generally, this is a safe procedure. However, problems may occur, including:  Allergic reaction to dye (contrast) that may be used during the procedure. What happens before the procedure? No specific preparation is needed. You may eat and drink normally. What happens during the procedure?    An IV tube may be inserted into one of your veins.  You may receive contrast through this tube. A contrast is an injection that improves the quality of the pictures from your heart.  A gel will be applied to your chest.  A wand-like tool (transducer) will be moved over your chest. The gel will help to transmit the sound waves from the transducer.  The sound waves will harmlessly bounce off of your heart to allow the heart images to be captured in real-time motion. The images will be recorded on a computer. The procedure may vary among health care providers and hospitals. What happens after the procedure?  You may return to your normal, everyday life, including diet, activities, and medicines, unless your health care provider tells you not to do that. Summary  An echocardiogram is a procedure that uses painless sound waves (ultrasound) to produce an image of the heart.  Images from an echocardiogram can  provide important information about the size and shape of your heart, heart muscle function, heart valve function, and fluid buildup around your heart.  You do not need to do anything to prepare before this procedure. You may eat and drink normally.  After the echocardiogram is completed, you may return to your normal, everyday life, unless your health care provider tells you not to do that. This information is not intended to replace advice given to you by your health care provider.  Make sure you discuss any questions you have with your health care provider. Document Revised: 02/20/2019 Document Reviewed: 12/02/2016 Elsevier Patient Education  2020 [employer].

## 2020-08-31 DIAGNOSIS — Z13220 Encounter for screening for lipoid disorders: Secondary | ICD-10-CM | POA: Diagnosis not present

## 2020-09-01 LAB — LIPID PANEL
Chol/HDL Ratio: 2.8 ratio (ref 0.0–4.4)
Cholesterol, Total: 176 mg/dL (ref 100–199)
HDL: 63 mg/dL (ref >39)
LDL Chol Calc (NIH): 102 mg/dL — ABNORMAL HIGH (ref 0–99)
Triglycerides: 58 mg/dL (ref 0–149)
VLDL Cholesterol Cal: 11 mg/dL (ref 5–40)

## 2020-09-01 LAB — HEPATIC FUNCTION PANEL
ALT: 7 IU/L (ref 0–32)
AST: 13 IU/L (ref 0–40)
Albumin: 4.4 g/dL (ref 3.8–4.8)
Alkaline Phosphatase: 76 IU/L (ref 44–121)
Bilirubin Total: 0.4 mg/dL (ref 0.0–1.2)
Bilirubin, Direct: 0.11 mg/dL (ref 0.00–0.40)
Total Protein: 6.9 g/dL (ref 6.0–8.5)

## 2020-09-08 ENCOUNTER — Other Ambulatory Visit: Payer: Self-pay

## 2020-09-08 ENCOUNTER — Encounter (HOSPITAL_COMMUNITY): Payer: BC Managed Care – PPO

## 2020-09-08 ENCOUNTER — Ambulatory Visit (HOSPITAL_COMMUNITY)
Admission: RE | Admit: 2020-09-08 | Discharge: 2020-09-08 | Disposition: A | Discharge status: Home / Self Care | Payer: BC Managed Care – PPO | Source: Ambulatory Visit | Attending: Cardiology | Admitting: Cardiology

## 2020-09-08 DIAGNOSIS — R079 Chest pain, unspecified: Secondary | ICD-10-CM | POA: Diagnosis not present

## 2020-09-08 DIAGNOSIS — R011 Cardiac murmur, unspecified: Secondary | ICD-10-CM | POA: Diagnosis not present

## 2020-09-08 LAB — ECHOCARDIOGRAM COMPLETE
Area-P 1/2: 4.39 cm2
Calc EF: 59.6 %
S' Lateral: 2.8 cm
Single Plane A2C EF: 62.3 %
Single Plane A4C EF: 56.9 %

## 2020-09-08 NOTE — Progress Notes (Signed)
  Echocardiogram 2D Echocardiogram has been performed.  Jamie Bauer 09/08/2020, 2:30 PM

## 2020-09-09 ENCOUNTER — Telehealth (HOSPITAL_COMMUNITY): Payer: Self-pay | Admitting: *Deleted

## 2020-09-09 NOTE — Telephone Encounter (Signed)
Close encounter 

## 2020-09-10 ENCOUNTER — Ambulatory Visit (HOSPITAL_COMMUNITY)
Admission: RE | Admit: 2020-09-10 | Discharge: 2020-09-10 | Disposition: A | Discharge status: Home / Self Care | Payer: BC Managed Care – PPO | Source: Ambulatory Visit | Attending: Internal Medicine | Admitting: Internal Medicine

## 2020-09-10 ENCOUNTER — Other Ambulatory Visit: Payer: Self-pay

## 2020-09-10 DIAGNOSIS — R072 Precordial pain: Secondary | ICD-10-CM | POA: Diagnosis not present

## 2020-09-10 DIAGNOSIS — R0789 Other chest pain: Secondary | ICD-10-CM | POA: Diagnosis not present

## 2020-09-10 LAB — MYOCARDIAL PERFUSION IMAGING
LV dias vol: 87 mL (ref 46–106)
LV sys vol: 39 mL
Peak HR: 109 {beats}/min
Rest HR: 66 {beats}/min
SDS: 0
SRS: 0
SSS: 0
TID: 1.15

## 2020-09-10 MED ORDER — REGADENOSON 0.4 MG/5ML IV SOLN
0.4000 mg | Freq: Once | INTRAVENOUS | Status: AC
Start: 2020-09-10 — End: 2020-09-10
  Administered 2020-09-10: 0.4 mg via INTRAVENOUS

## 2020-09-10 MED ORDER — TECHNETIUM TC 99M TETROFOSMIN IV KIT
30.8000 | PACK | Freq: Once | INTRAVENOUS | Status: AC | PRN
  Administered 2020-09-10: 30.8 via INTRAVENOUS
  Filled 2020-09-10: qty 31

## 2020-09-10 MED ORDER — TECHNETIUM TC 99M TETROFOSMIN IV KIT
10.5000 | PACK | Freq: Once | INTRAVENOUS | Status: AC | PRN
  Administered 2020-09-10: 10.5 via INTRAVENOUS
  Filled 2020-09-10: qty 11

## 2020-09-21 ENCOUNTER — Ambulatory Visit: Payer: BC Managed Care – PPO | Admitting: Cardiology

## 2020-10-25 DIAGNOSIS — Z20822 Contact with and (suspected) exposure to covid-19: Secondary | ICD-10-CM | POA: Diagnosis not present

## 2020-10-26 DIAGNOSIS — Z03818 Encounter for observation for suspected exposure to other biological agents ruled out: Secondary | ICD-10-CM | POA: Diagnosis not present

## 2021-02-08 IMAGING — CR DG CHEST 2V
2 series · 2 of 2 positions shown · non-contrast
Comparison: 08/02/2007

CLINICAL DATA: Left-sided chest pain for several days

EXAM:
CHEST - 2 VIEW

[w chest pa]
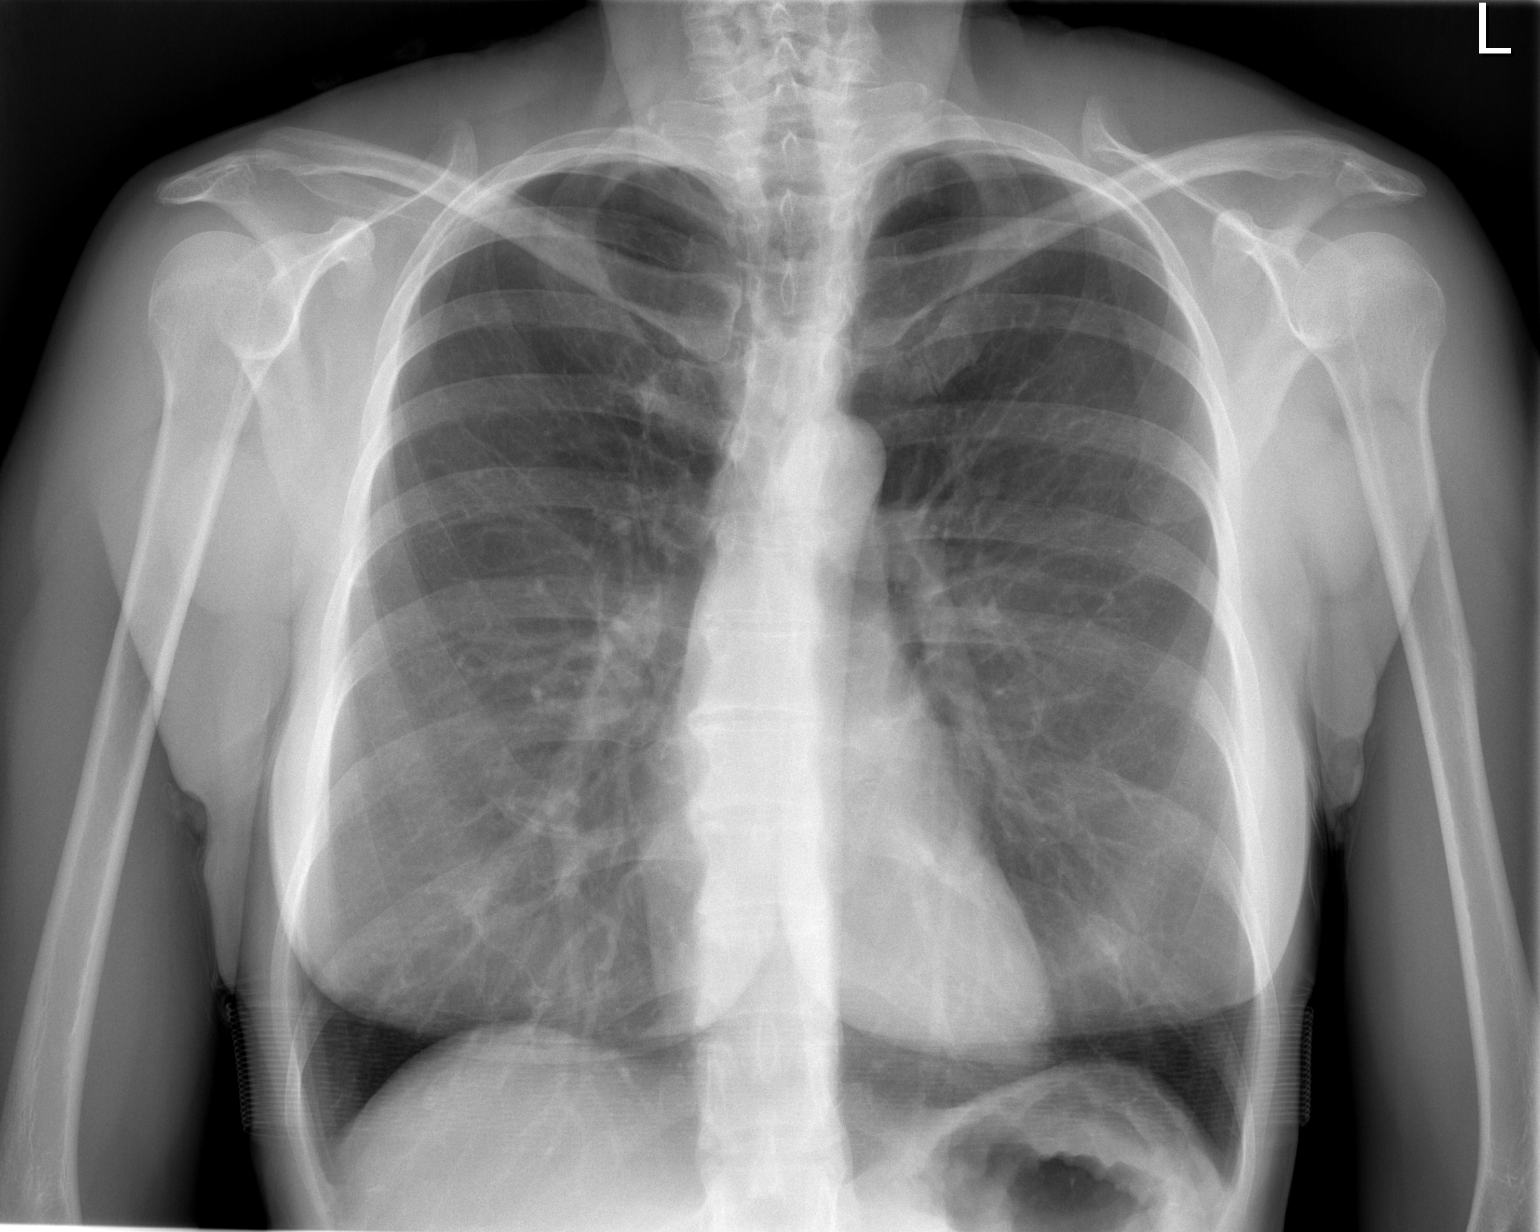

[w chest lat]
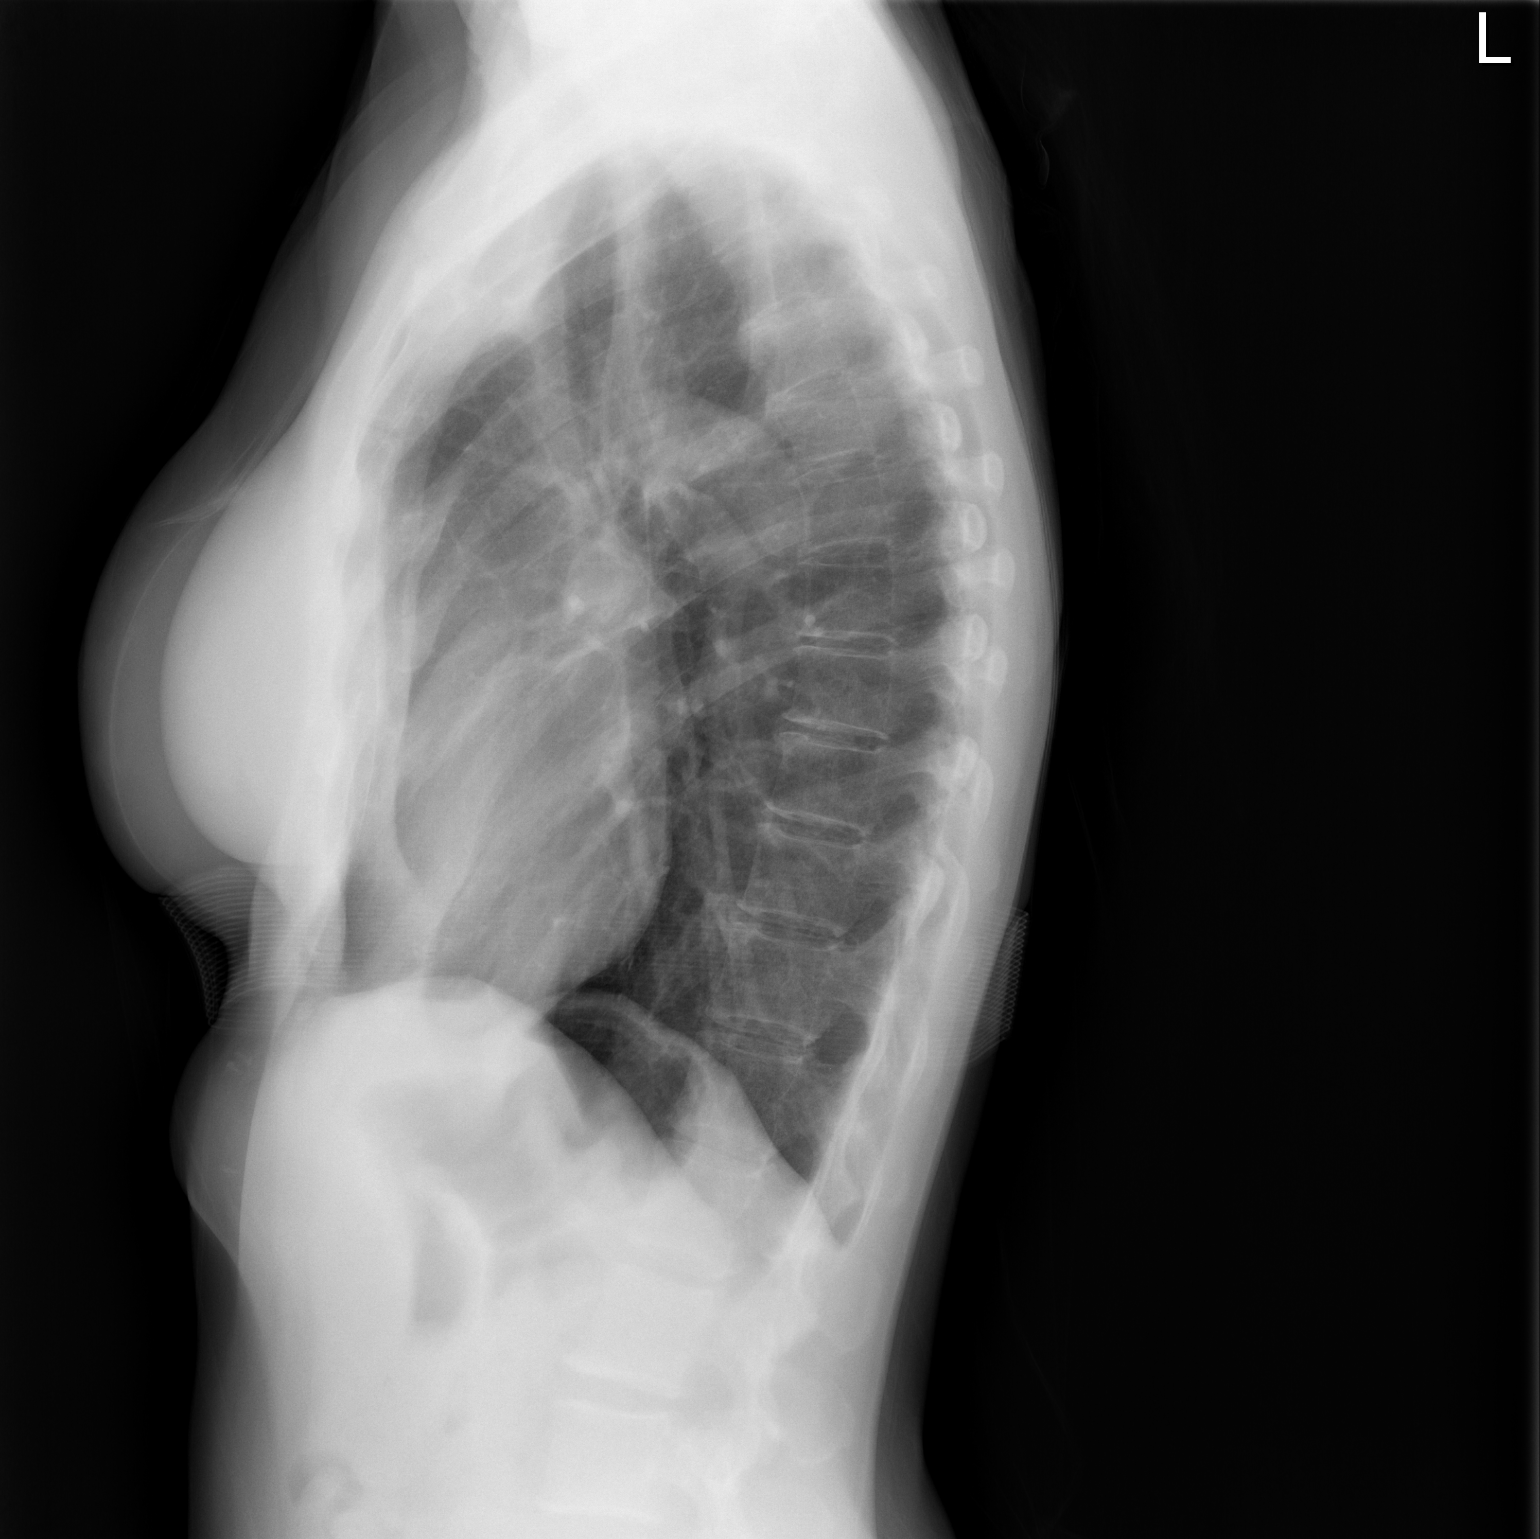

[2 of 2 positions shown; findings below may reference images not displayed]

FINDINGS: Frontal and lateral views of the chest demonstrate an unremarkable
cardiac silhouette. No airspace disease, effusion, or pneumothorax.
Pectus excavatum deformity again noted. No acute bony abnormalities.
IMPRESSION: 1. No acute intrathoracic process.

## 2021-04-07 DIAGNOSIS — Z1231 Encounter for screening mammogram for malignant neoplasm of breast: Secondary | ICD-10-CM | POA: Diagnosis not present

## 2021-04-25 DIAGNOSIS — R928 Other abnormal and inconclusive findings on diagnostic imaging of breast: Secondary | ICD-10-CM | POA: Diagnosis not present

## 2021-04-25 DIAGNOSIS — R922 Inconclusive mammogram: Secondary | ICD-10-CM | POA: Diagnosis not present

## 2021-04-25 LAB — HM MAMMOGRAPHY

## 2021-05-05 DIAGNOSIS — D1801 Hemangioma of skin and subcutaneous tissue: Secondary | ICD-10-CM | POA: Diagnosis not present

## 2021-05-05 DIAGNOSIS — L821 Other seborrheic keratosis: Secondary | ICD-10-CM | POA: Diagnosis not present

## 2021-05-05 DIAGNOSIS — L814 Other melanin hyperpigmentation: Secondary | ICD-10-CM | POA: Diagnosis not present

## 2021-05-05 DIAGNOSIS — X32XXXS Exposure to sunlight, sequela: Secondary | ICD-10-CM | POA: Diagnosis not present

## 2021-07-20 DIAGNOSIS — R0602 Shortness of breath: Secondary | ICD-10-CM | POA: Diagnosis not present

## 2021-07-20 DIAGNOSIS — Z01411 Encounter for gynecological examination (general) (routine) with abnormal findings: Secondary | ICD-10-CM | POA: Diagnosis not present

## 2021-07-20 DIAGNOSIS — M25562 Pain in left knee: Secondary | ICD-10-CM | POA: Diagnosis not present

## 2021-07-20 DIAGNOSIS — R079 Chest pain, unspecified: Secondary | ICD-10-CM | POA: Diagnosis not present

## 2021-10-31 ENCOUNTER — Other Ambulatory Visit: Payer: Self-pay

## 2021-10-31 ENCOUNTER — Encounter: Payer: Self-pay | Admitting: Family Medicine

## 2021-10-31 ENCOUNTER — Ambulatory Visit (INDEPENDENT_AMBULATORY_CARE_PROVIDER_SITE_OTHER): Payer: BC Managed Care – PPO | Admitting: Family Medicine

## 2021-10-31 ENCOUNTER — Ambulatory Visit (HOSPITAL_BASED_OUTPATIENT_CLINIC_OR_DEPARTMENT_OTHER)
Admission: RE | Admit: 2021-10-31 | Discharge: 2021-10-31 | Disposition: A | Discharge status: Home / Self Care | Payer: BC Managed Care – PPO | Source: Ambulatory Visit | Attending: Family Medicine | Admitting: Family Medicine

## 2021-10-31 VITALS — BP 109/73 | HR 76 | Temp 98.3°F | Ht 62.5 in | Wt 113.0 lb

## 2021-10-31 DIAGNOSIS — R2242 Localized swelling, mass and lump, left lower limb: Secondary | ICD-10-CM

## 2021-10-31 DIAGNOSIS — E041 Nontoxic single thyroid nodule: Secondary | ICD-10-CM

## 2021-10-31 DIAGNOSIS — Z0000 Encounter for general adult medical examination without abnormal findings: Secondary | ICD-10-CM

## 2021-10-31 DIAGNOSIS — E559 Vitamin D deficiency, unspecified: Secondary | ICD-10-CM | POA: Diagnosis not present

## 2021-10-31 DIAGNOSIS — Z130 Encounter for screening for diseases of the blood and blood-forming organs and certain disorders involving the immune mechanism: Secondary | ICD-10-CM | POA: Diagnosis not present

## 2021-10-31 DIAGNOSIS — Z131 Encounter for screening for diabetes mellitus: Secondary | ICD-10-CM

## 2021-10-31 DIAGNOSIS — Z13220 Encounter for screening for lipoid disorders: Secondary | ICD-10-CM | POA: Diagnosis not present

## 2021-10-31 DIAGNOSIS — Z1211 Encounter for screening for malignant neoplasm of colon: Secondary | ICD-10-CM

## 2021-10-31 DIAGNOSIS — M79672 Pain in left foot: Secondary | ICD-10-CM

## 2021-10-31 HISTORY — DX: Localized swelling, mass and lump, left lower limb: R22.42

## 2021-10-31 LAB — CBC WITH DIFFERENTIAL/PLATELET
Basophils Absolute: 0 10*3/uL (ref 0.0–0.1)
Basophils Relative: 0.3 % (ref 0.0–3.0)
Eosinophils Absolute: 0.1 10*3/uL (ref 0.0–0.7)
Eosinophils Relative: 1.1 % (ref 0.0–5.0)
HCT: 38.1 % (ref 36.0–46.0)
Hemoglobin: 13 g/dL (ref 12.0–15.0)
Lymphocytes Relative: 21.4 % (ref 12.0–46.0)
Lymphs Abs: 1.4 10*3/uL (ref 0.7–4.0)
MCHC: 34.1 g/dL (ref 30.0–36.0)
MCV: 93.2 fl (ref 78.0–100.0)
Monocytes Absolute: 0.3 10*3/uL (ref 0.1–1.0)
Monocytes Relative: 4.8 % (ref 3.0–12.0)
Neutro Abs: 4.8 10*3/uL (ref 1.4–7.7)
Neutrophils Relative %: 72.4 % (ref 43.0–77.0)
Platelets: 256 10*3/uL (ref 150.0–400.0)
RBC: 4.09 Mil/uL (ref 3.87–5.11)
RDW: 12.5 % (ref 11.5–15.5)
WBC: 6.6 10*3/uL (ref 4.0–10.5)

## 2021-10-31 NOTE — Patient Instructions (Signed)
°Great to see you today.  °I have refilled the medication(s) we provide.  ° °If labs were collected, we will inform you of lab results once received either by echart message or telephone call.  ° - echart message- for normal results that have been seen by the patient already.  ° - telephone call: abnormal results or if patient has not viewed results in their echart. °Health Maintenance After Age 50 °After age 50, you are at a higher risk for certain long-term diseases and infections as well as injuries from falls. Falls are a major cause of broken bones and head injuries in people who are older than age 50. Getting regular preventive care can help to keep you healthy and well. Preventive care includes getting regular testing and making lifestyle changes as recommended by your health care provider. Talk with your health care provider about: °Which screenings and tests you should have. A screening is a test that checks for a disease when you have no symptoms. °A diet and exercise plan that is right for you. °What should I know about screenings and tests to prevent falls? °Screening and testing are the best ways to find a health problem early. Early diagnosis and treatment give you the best chance of managing medical conditions that are common after age 50. Certain conditions and lifestyle choices may make you more likely to have a fall. Your health care provider may recommend: °Regular vision checks. Poor vision and conditions such as cataracts can make you more likely to have a fall. If you wear glasses, make sure to get your prescription updated if your vision changes. °Medicine review. Work with your health care provider to regularly review all of the medicines you are taking, including over-the-counter medicines. Ask your health care provider about any side effects that may make you more likely to have a fall. Tell your health care provider if any medicines that you take make you feel dizzy or sleepy. °Strength  and balance checks. Your health care provider may recommend certain tests to check your strength and balance while standing, walking, or changing positions. °Foot health exam. Foot pain and numbness, as well as not wearing proper footwear, can make you more likely to have a fall. °Screenings, including: °Osteoporosis screening. Osteoporosis is a condition that causes the bones to get weaker and break more easily. °Blood pressure screening. Blood pressure changes and medicines to control blood pressure can make you feel dizzy. °Depression screening. You may be more likely to have a fall if you have a fear of falling, feel depressed, or feel unable to do activities that you used to do. °Alcohol use screening. Using too much alcohol can affect your balance and may make you more likely to have a fall. °Follow these instructions at home: °Lifestyle °Do not drink alcohol if: °Your health care provider tells you not to drink. °If you drink alcohol: °Limit how much you have to: °0-1 drink a day for women. °0-2 drinks a day for men. °Know how much alcohol is in your drink. In the [place] one drink equals one 12 oz bottle of beer (355 mL), one 5 oz glass of wine (148 mL), or one 1½ oz glass of hard liquor (44 mL). °Do not use any products that contain nicotine or tobacco. These products include cigarettes, chewing tobacco, and vaping devices, such as e-cigarettes. If you need help quitting, ask your health care provider. °Activity ° °Follow a regular exercise program to stay fit. This will help you maintain   your balance. Ask your health care provider what types of exercise are appropriate for you. °If you need a cane or walker, use it as recommended by your health care provider. °Wear supportive shoes that have nonskid soles. °Safety ° °Remove any tripping hazards, such as rugs, cords, and clutter. °Install safety equipment such as grab bars in bathrooms and safety rails on stairs. °Keep rooms and walkways well-lit. °General  instructions °Talk with your health care provider about your risks for falling. Tell your health care provider if: °You fall. Be sure to tell your health care provider about all falls, even ones that seem minor. °You feel dizzy, tiredness (fatigue), or off-balance. °Take over-the-counter and prescription medicines only as told by your health care provider. These include supplements. °Eat a healthy diet and maintain a healthy weight. A healthy diet includes low-fat dairy products, low-fat (lean) meats, and fiber from whole grains, beans, and lots of fruits and vegetables. °Stay current with your vaccines. °Schedule regular health, dental, and eye exams. °Summary °Having a healthy lifestyle and getting preventive care can help to protect your health and wellness after age 50. °Screening and testing are the best way to find a health problem early and help you avoid having a fall. Early diagnosis and treatment give you the best chance for managing medical conditions that are more common for people who are older than age 50. °Falls are a major cause of broken bones and head injuries in people who are older than age 50. Take precautions to prevent a fall at home. °Work with your health care provider to learn what changes you can make to improve your health and wellness and to prevent falls. °This information is not intended to replace advice given to you by your health care provider. Make sure you discuss any questions you have with your health care provider. °Document Revised: 03/21/2021 Document Reviewed: 03/21/2021 °Elsevier Patient Education © 2022 [employer]. ° °

## 2021-10-31 NOTE — Progress Notes (Addendum)
This visit occurred during the SARS-CoV-2 public health emergency.  Safety protocols were in place, including screening questions prior to the visit, additional usage of staff PPE, and extensive cleaning of exam room while observing appropriate contact time as indicated for disinfecting solutions.    Patient ID: Jamie Bauer, female  DOB: 1971/10/05, 50 y.o.   MRN: [id number] Patient Care Team    Relationship Specialty Notifications Start End  Natalia Leatherwood, DO PCP - General Family Medicine  08/03/20   Donna Christen, [place]  Optometry  08/05/20   Graceann Congress, MD  Obstetrics and Gynecology  08/05/20     Chief Complaint  Patient presents with   Annual Exam    Pt is fasting    Subjective: Jamie Bauer is a 50 y.o.  Female  present for CPE . All past medical history, surgical history, allergies, family history, immunizations, medications and social history were update in the electronic medical record today. All recent labs, ED visits and hospitalizations within the last year were reviewed.  Health maintenance:  Colonoscopy: no fhx. Cologuard testing desired> ordered.  Mammogram: completed 04/2021. Cervical cancer screening: last pap: 07/2021,  GYN Immunizations: tdap 03/2015, Influenza declined (encouraged yearly), zostavax declined, covid declined Infectious disease screening: HIV completed, Hep C declined DEXA: routine screen 60-65 Assistive device: none Oxygen VFI:EPPI Patient has a Dental home. Hospitalizations/ED visits: reviewed  Depression screen [facility] 2/9 08/03/2020  Decreased Interest 1  Down, Depressed, Hopeless 0  PHQ - 2 Score 1  Altered sleeping 1  Tired, decreased energy 1  Change in appetite 0  Feeling bad or failure about yourself  1  Trouble concentrating 0  Moving slowly or fidgety/restless 0  Suicidal thoughts 0  PHQ-9 Score 4   GAD 7 : Generalized Anxiety Score 08/03/2020  Nervous, Anxious, on Edge 1  Control/stop worrying 1  Worry too much -  different things 1  Trouble relaxing 0  Restless 0  Easily annoyed or irritable 1  Afraid - awful might happen 1  Total GAD 7 Score 5   Immunization History  Administered Date(s) Administered   Td 09/13/2004   Tdap 04/08/2015    Past Medical History:  Diagnosis Date   Anxiety    BREAST IMPLANTS, BILATERAL, HX OF 08/02/2007   Qualifier: Diagnosis of  By: Drue Novel MD, Nolon Rod.    Chest pain 08/05/2020   Chicken pox    Depression    Endocervical polyp    Right hip pain 11/12/2016   THYROID NODULE, HX OF 08/02/2007   Qualifier: Diagnosis of  By: Drue Novel MD, Nolon Rod.    Vitamin D deficiency 11/2019   No Known Allergies Past Surgical History:  Procedure Laterality Date   BREAST ENHANCEMENT SURGERY     age 24   CESAREAN SECTION  2003   2003 and 2008   SEPTOPLASTY  2008   Family History  Problem Relation Age of Onset   Depression Mother    Anxiety disorder Mother    Hyperlipidemia Mother    Hypertension Mother    Alcohol abuse Father    Hyperlipidemia Father    Hypertension Father    Breast cancer Maternal Aunt 30   Breast cancer Maternal Aunt 85   Social History   Social History Narrative   Marital status/children/pets: Married with 2 children   Education/employment: Bachelor's degree.  Works as an Print production planner.   Safety:      -smoke alarm in the home:Yes     - wears seatbelt:  Yes     - Feels safe in their relationships: Yes    Allergies as of 10/31/2021   No Known Allergies      Medication List        Accurate as of October 31, 2021 11:03 AM. If you have any questions, ask your nurse or doctor.          MAGNEBIND 400 PO Take by mouth.   Selenium 200 MCG Caps Take 200 mcg by mouth daily.   VITAMIN C PO Take 2,500 mg by mouth.   Vitamin D 50 MCG (2000 UT) Caps Take 2,000 Units by mouth daily.        All past medical history, surgical history, allergies, family history, immunizations andmedications were updated in the EMR today and reviewed under  the history and medication portions of their EMR.     No results found for this or any previous visit (from the past 2160 hour(s)).  MYOCARDIAL PERFUSION IMAGING  Result Date: 09/10/2020  Eugenie Birks is electrically negative for ischemia  Myovue shows normal perfusion. No ischemia or scar  LVEF is 55%  This is a low risk study.     ROS 14 pt review of systems performed and negative (unless mentioned in an HPI)  Objective:  BP 109/73    Pulse 76    Temp 98.3 F (36.8 C) (Oral)    Ht 5' 2.5" (1.588 m)    Wt 113 lb (51.3 kg)    LMP 10/14/2021    SpO2 100%    BMI 20.34 kg/m  Physical Exam Vitals and nursing note reviewed. Exam conducted with a chaperone present.  Constitutional:      General: She is not in acute distress.    Appearance: Normal appearance. She is not ill-appearing or toxic-appearing.  HENT:     Head: Normocephalic and atraumatic.     Right Ear: Tympanic membrane, ear canal and external ear normal. There is no impacted cerumen.     Left Ear: Tympanic membrane, ear canal and external ear normal. There is no impacted cerumen.     Nose: No congestion or rhinorrhea.     Mouth/Throat:     Mouth: Mucous membranes are moist.     Pharynx: Oropharynx is clear. No oropharyngeal exudate or posterior oropharyngeal erythema.  Eyes:     General: No scleral icterus.       Right eye: No discharge.        Left eye: No discharge.     Extraocular Movements: Extraocular movements intact.     Conjunctiva/sclera: Conjunctivae normal.     Pupils: Pupils are equal, round, and reactive to light.  Cardiovascular:     Rate and Rhythm: Normal rate and regular rhythm.     Pulses: Normal pulses.     Heart sounds: Normal heart sounds. No murmur heard.   No friction rub. No gallop.  Pulmonary:     Effort: Pulmonary effort is normal. No respiratory distress.     Breath sounds: Normal breath sounds. No stridor. No wheezing, rhonchi or rales.  Chest:     Chest wall: No tenderness.   Abdominal:     General: Abdomen is flat. Bowel sounds are normal. There is no distension.     Palpations: Abdomen is soft. There is no mass.     Tenderness: There is no abdominal tenderness. There is no right CVA tenderness, left CVA tenderness, guarding or rebound.     Hernia: No hernia is present.  Musculoskeletal:  General: Tenderness (bony growth left 5th metarsal shaft, hard, non-mobile.) present. No swelling or deformity. Normal range of motion.     Cervical back: Normal range of motion and neck supple. No rigidity or tenderness.     Right lower leg: No edema.     Left lower leg: No edema.  Lymphadenopathy:     Cervical: No cervical adenopathy.  Skin:    General: Skin is warm and dry.     Coloration: Skin is not jaundiced or pale.     Findings: No bruising, erythema, lesion or rash.  Neurological:     General: No focal deficit present.     Mental Status: She is alert and oriented to person, place, and time. Mental status is at baseline.     Cranial Nerves: No cranial nerve deficit.     Sensory: No sensory deficit.     Motor: No weakness.     Coordination: Coordination normal.     Gait: Gait normal.     Deep Tendon Reflexes: Reflexes normal.  Psychiatric:        Mood and Affect: Mood normal.        Behavior: Behavior normal.        Thought Content: Thought content normal.        Judgment: Judgment normal.     No results found.  Assessment/plan: Jamie Bauer is a 50 y.o. female present for CPE/ acute concern.  Vitamin D deficiency - VITAMIN D 25 Hydroxy (Vit-D Deficiency, Fractures) Thyroid nodule - TSH Colon cancer screening - Cologuard Lipid screening - Lipid panel Diabetes mellitus screening - Comprehensive metabolic panel - Hemoglobin A1c Screening for deficiency anemia - CBC with Differential/Platelet Foot mass, left/foot pain Abnormal left foot exam with bony growth and pain associated  - DG Foot Complete Left; Future - follow up dependent upon  image results. > consider podiatry referral .   Routine general medical examination at a health care facility Colonoscopy: no fhx. Cologuard testing desired> ordered.  Mammogram: completed 04/2021. Cervical cancer screening: last pap: 07/2021,  GYN Immunizations: tdap 03/2015, Influenza declined (encouraged yearly), zostavax declined, covid declined Infectious disease screening: HIV completed, Hep C declined DEXA: routine screen 60-65 Patient was encouraged to exercise greater than 150 minutes a week. Patient was encouraged to choose a diet filled with fresh fruits and vegetables, and lean meats. AVS provided to patient today for education/recommendation on gender specific health and safety maintenance.  Return in about 1 year (around 11/01/2022) for CPE (30 min).    Orders Placed This Encounter  Procedures   DG Foot Complete Left   HM MAMMOGRAPHY   CBC with Differential/Platelet   Comprehensive metabolic panel   Hemoglobin A1c   Lipid panel   VITAMIN D 25 Hydroxy (Vit-D Deficiency, Fractures)   TSH   Cologuard   HM PAP SMEAR   No orders of the defined types were placed in this encounter.  Referral Orders  No referral(s) requested today     Electronically signed by: Felix Pacini, DO [facility]

## 2021-11-01 LAB — COMPREHENSIVE METABOLIC PANEL
ALT: 12 U/L (ref 0–35)
AST: 14 U/L (ref 0–37)
Albumin: 4.4 g/dL (ref 3.5–5.2)
Alkaline Phosphatase: 62 U/L (ref 39–117)
BUN: 14 mg/dL (ref 6–23)
CO2: 24 mEq/L (ref 19–32)
Calcium: 9.6 mg/dL (ref 8.4–10.5)
Chloride: 104 mEq/L (ref 96–112)
Creatinine, Ser: 0.64 mg/dL (ref 0.40–1.20)
GFR: 102.72 mL/min (ref >60.00)
Glucose, Bld: 85 mg/dL (ref 70–99)
Potassium: 4.3 mEq/L (ref 3.5–5.1)
Sodium: 138 mEq/L (ref 135–145)
Total Bilirubin: 0.5 mg/dL (ref 0.2–1.2)
Total Protein: 7.4 g/dL (ref 6.0–8.3)

## 2021-11-01 LAB — LIPID PANEL
Cholesterol: 177 mg/dL (ref 0–200)
HDL: 65.3 mg/dL (ref >39.00)
LDL Cholesterol: 102 mg/dL — ABNORMAL HIGH (ref 0–99)
NonHDL: 111.73
Total CHOL/HDL Ratio: 3
Triglycerides: 51 mg/dL (ref 0.0–149.0)
VLDL: 10.2 mg/dL (ref 0.0–40.0)

## 2021-11-01 LAB — TSH: TSH: 2.81 u[IU]/mL (ref 0.35–5.50)

## 2021-11-01 LAB — VITAMIN D 25 HYDROXY (VIT D DEFICIENCY, FRACTURES): VITD: 51.75 ng/mL (ref 30.00–100.00)

## 2021-11-01 LAB — HEMOGLOBIN A1C: Hgb A1c MFr Bld: 4.7 % (ref 4.6–6.5)

## 2021-11-08 ENCOUNTER — Ambulatory Visit (INDEPENDENT_AMBULATORY_CARE_PROVIDER_SITE_OTHER): Payer: BC Managed Care – PPO

## 2021-11-08 ENCOUNTER — Other Ambulatory Visit: Payer: Self-pay

## 2021-11-08 DIAGNOSIS — Z23 Encounter for immunization: Secondary | ICD-10-CM | POA: Diagnosis not present

## 2021-11-15 LAB — COLOGUARD: COLOGUARD: NEGATIVE

## 2022-05-05 IMAGING — DX DG FOOT COMPLETE 3+V*L*
3 series · 3 of 3 positions shown · non-contrast
Comparison: None.

CLINICAL DATA: Palpable abnormality overlying fourth and fifth mid
metatarsal

EXAM:
LEFT FOOT - COMPLETE 3+ VIEW

[foot ap]
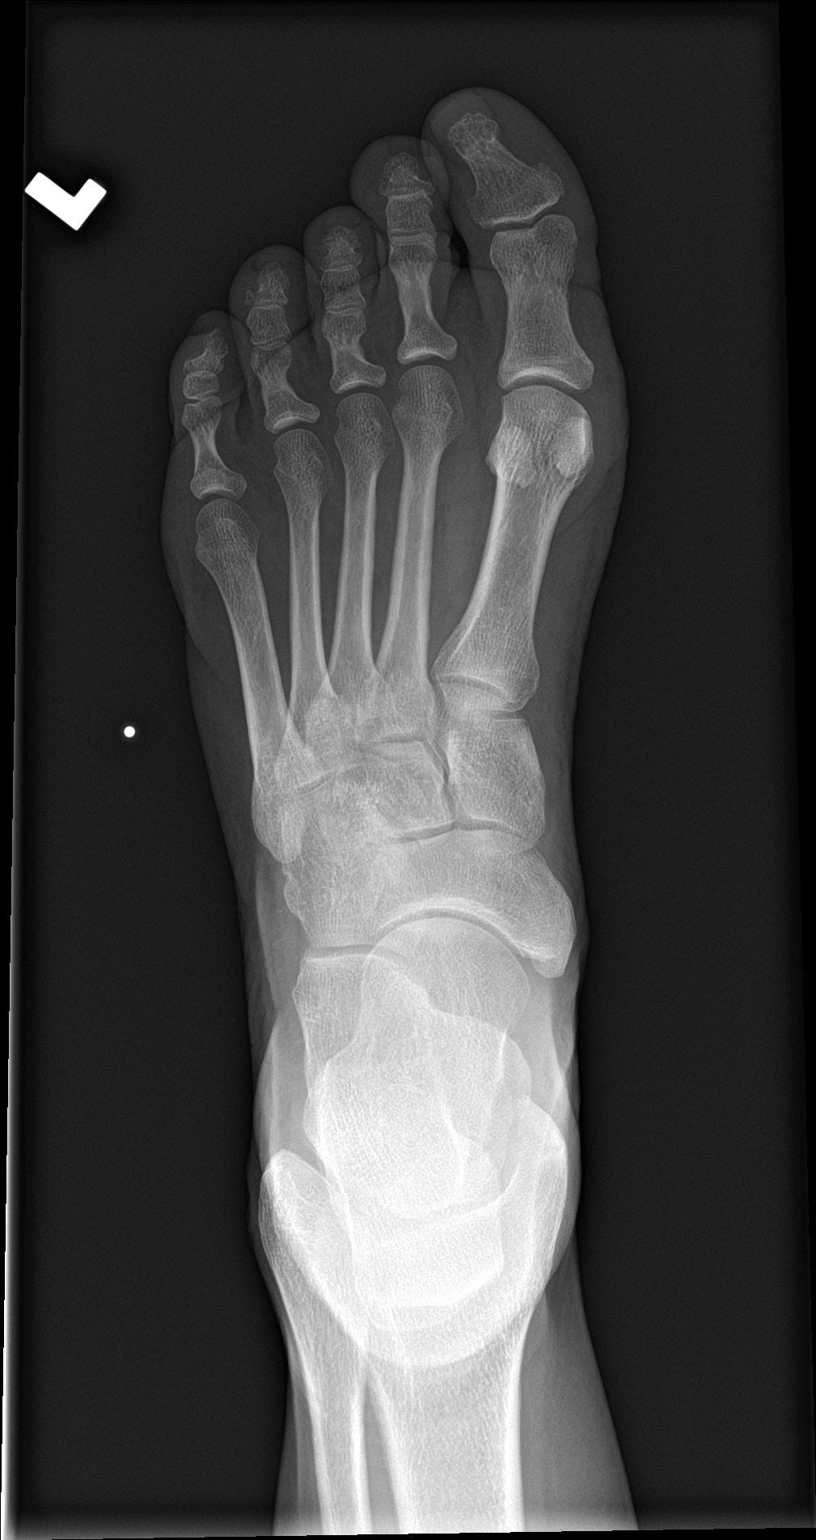

[foot obl]
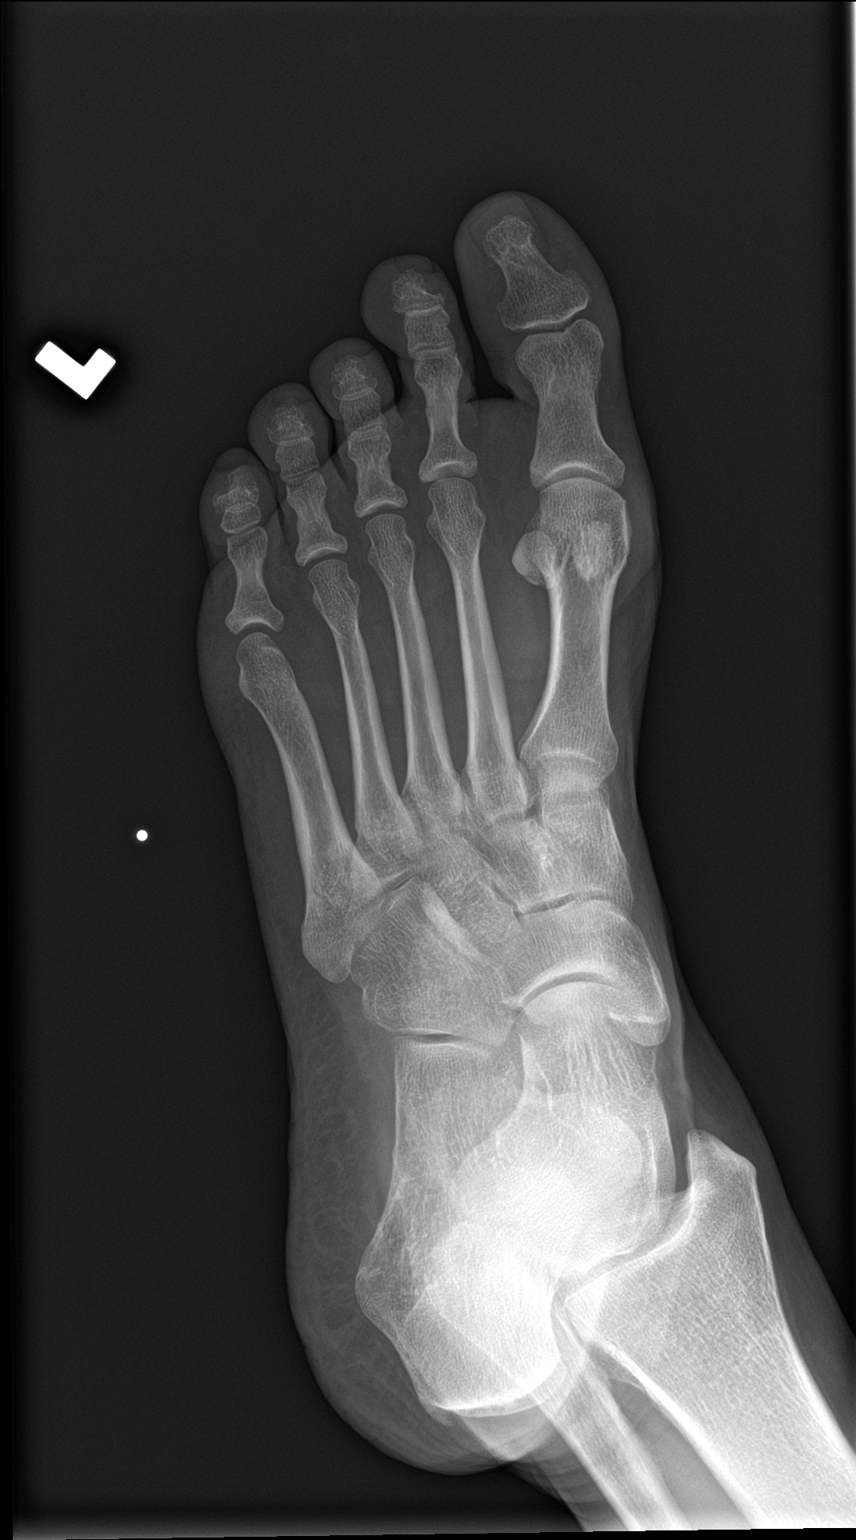

[foot lat]
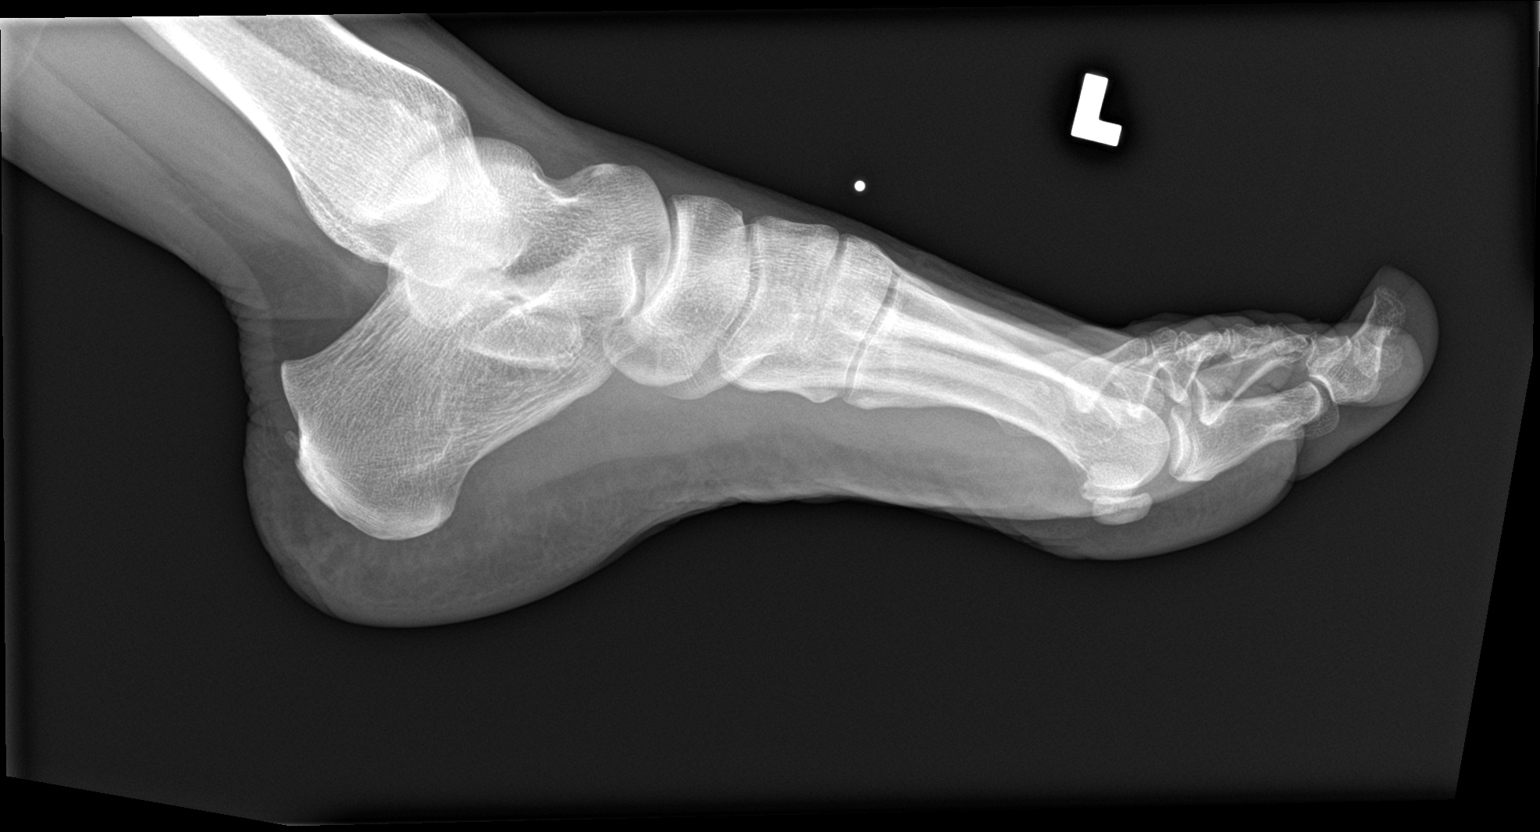

[3 of 3 positions shown; findings below may reference images not displayed]

FINDINGS: Frontal, oblique, and lateral views of the left foot are obtained.
Metallic marker was placed at the site of the palpable abnormality.
There are no underlying radiographic findings to correspond to this
palpable area.

No fracture, subluxation, or dislocation. Joint spaces are well
preserved. Soft tissues are normal.
IMPRESSION: 1. Unremarkable left foot. No radiographic finding to correspond to
the palpable abnormality.

## 2022-12-20 ENCOUNTER — Encounter: Payer: BC Managed Care – PPO | Admitting: Family Medicine

## 2022-12-27 ENCOUNTER — Encounter: Payer: BC Managed Care – PPO | Admitting: Family Medicine

## 2023-01-01 ENCOUNTER — Encounter: Payer: Self-pay | Admitting: Family Medicine

## 2023-01-01 ENCOUNTER — Ambulatory Visit: Payer: Commercial Managed Care - PPO | Admitting: Family Medicine

## 2023-01-01 VITALS — BP 105/74 | HR 82 | Temp 98.0°F | Ht 62.6 in | Wt 111.2 lb

## 2023-01-01 DIAGNOSIS — E559 Vitamin D deficiency, unspecified: Secondary | ICD-10-CM

## 2023-01-01 DIAGNOSIS — Z01818 Encounter for other preprocedural examination: Secondary | ICD-10-CM | POA: Diagnosis not present

## 2023-01-01 DIAGNOSIS — E041 Nontoxic single thyroid nodule: Secondary | ICD-10-CM | POA: Diagnosis not present

## 2023-01-01 DIAGNOSIS — Z1211 Encounter for screening for malignant neoplasm of colon: Secondary | ICD-10-CM | POA: Diagnosis not present

## 2023-01-01 DIAGNOSIS — Z1231 Encounter for screening mammogram for malignant neoplasm of breast: Secondary | ICD-10-CM

## 2023-01-01 DIAGNOSIS — Z13220 Encounter for screening for lipoid disorders: Secondary | ICD-10-CM

## 2023-01-01 DIAGNOSIS — Z0000 Encounter for general adult medical examination without abnormal findings: Secondary | ICD-10-CM | POA: Diagnosis not present

## 2023-01-01 DIAGNOSIS — Z130 Encounter for screening for diseases of the blood and blood-forming organs and certain disorders involving the immune mechanism: Secondary | ICD-10-CM

## 2023-01-01 DIAGNOSIS — Z131 Encounter for screening for diabetes mellitus: Secondary | ICD-10-CM | POA: Diagnosis not present

## 2023-01-01 DIAGNOSIS — Z0289 Encounter for other administrative examinations: Secondary | ICD-10-CM

## 2023-01-01 LAB — COMPREHENSIVE METABOLIC PANEL
ALT: 15 U/L (ref 0–35)
AST: 17 U/L (ref 0–37)
Albumin: 4.4 g/dL (ref 3.5–5.2)
Alkaline Phosphatase: 66 U/L (ref 39–117)
BUN: 11 mg/dL (ref 6–23)
CO2: 28 mEq/L (ref 19–32)
Calcium: 9.7 mg/dL (ref 8.4–10.5)
Chloride: 102 mEq/L (ref 96–112)
Creatinine, Ser: 0.66 mg/dL (ref 0.40–1.20)
GFR: 101.13 mL/min (ref >60.00)
Glucose, Bld: 88 mg/dL (ref 70–99)
Potassium: 3.9 mEq/L (ref 3.5–5.1)
Sodium: 137 mEq/L (ref 135–145)
Total Bilirubin: 0.5 mg/dL (ref 0.2–1.2)
Total Protein: 7.3 g/dL (ref 6.0–8.3)

## 2023-01-01 LAB — HEMOGLOBIN A1C: Hgb A1c MFr Bld: 4.6 % (ref 4.6–6.5)

## 2023-01-01 LAB — CBC WITH DIFFERENTIAL/PLATELET
Basophils Absolute: 0 10*3/uL (ref 0.0–0.1)
Basophils Relative: 0.4 % (ref 0.0–3.0)
Eosinophils Absolute: 0.1 10*3/uL (ref 0.0–0.7)
Eosinophils Relative: 1.5 % (ref 0.0–5.0)
HCT: 39.6 % (ref 36.0–46.0)
Hemoglobin: 13.5 g/dL (ref 12.0–15.0)
Lymphocytes Relative: 24.8 % (ref 12.0–46.0)
Lymphs Abs: 1.4 10*3/uL (ref 0.7–4.0)
MCHC: 34 g/dL (ref 30.0–36.0)
MCV: 93.2 fl (ref 78.0–100.0)
Monocytes Absolute: 0.2 10*3/uL (ref 0.1–1.0)
Monocytes Relative: 4 % (ref 3.0–12.0)
Neutro Abs: 3.8 10*3/uL (ref 1.4–7.7)
Neutrophils Relative %: 69.3 % (ref 43.0–77.0)
Platelets: 310 10*3/uL (ref 150.0–400.0)
RBC: 4.25 Mil/uL (ref 3.87–5.11)
RDW: 12.6 % (ref 11.5–15.5)
WBC: 5.5 10*3/uL (ref 4.0–10.5)

## 2023-01-01 LAB — LIPID PANEL
Cholesterol: 204 mg/dL — ABNORMAL HIGH (ref 0–200)
HDL: 66 mg/dL (ref >39.00)
LDL Cholesterol: 120 mg/dL — ABNORMAL HIGH (ref 0–99)
NonHDL: 137.87
Total CHOL/HDL Ratio: 3
Triglycerides: 90 mg/dL (ref 0.0–149.0)
VLDL: 18 mg/dL (ref 0.0–40.0)

## 2023-01-01 LAB — APTT: aPTT: 33.7 s (ref 25.4–36.8)

## 2023-01-01 LAB — PROTIME-INR
INR: 1 ratio (ref 0.8–1.0)
Prothrombin Time: 10.9 s (ref 9.6–13.1)

## 2023-01-01 LAB — TSH: TSH: 2.65 u[IU]/mL (ref 0.35–5.50)

## 2023-01-01 NOTE — Patient Instructions (Addendum)
Return in about 1 year (around 01/03/2024) for cpe (20 min).        Great to see you today.  I have refilled the medication(s) we provide.   If labs were collected, we will inform you of lab results once received either by echart message or telephone call.   - echart message- for normal results that have been seen by the patient already.   - telephone call: abnormal results or if patient has not viewed results in their echart.

## 2023-01-01 NOTE — Progress Notes (Signed)
Patient ID: Jamie Bauer, female  DOB: [birth year], 52 y.o.   MRN: [id number] Patient Care Team    Relationship Specialty Notifications Start End  Jamie Hillock, DO PCP - General Family Medicine  08/03/20   Curt Jews, [place]  Optometry  08/05/20   Judie Bonus, MD  Obstetrics and Gynecology  08/05/20     Chief Complaint  Patient presents with   Annual Exam    Pt is fasting    Subjective: Jamie Bauer is a 52 y.o.  Female  present for CPE with acute issue of needing a pre-op clearance  for "5 hour surgery under general anesthesia." All past medical history, surgical history, allergies, family history, immunizations, medications and social history were update in the electronic medical record today. All recent labs, ED visits and hospitalizations within the last year were reviewed.  Health maintenance:  Colon cancer screen: no fhx.  Cologuard completed 11/07/2021.  Repeat in 3 years Mammogram: completed 04/2021.  High [employer] imaging. Will need to wait until surgery clears her after procedure. Cervical cancer screening: last pap: 07/2021,  GYN Immunizations: tdap 03/2015, Influenza declined (encouraged yearly), Shingrix x 1- declines second Infectious disease screening: HIV completed, Hep C declined DEXA: routine screen  Assistive device: none Oxygen [id number] Patient has a Dental home. Hospitalizations/ED visits: Reviewed  Procedure:bilateral breast implant removal with fat grafting and mastopexy.  Anesthesia:General Surgery type risk:   - Intermediate risk Prior anesthesia complications: no Family history of prior anesthesia complications:no Cardiac: murmur   - no known disease   - METs: > 4   - EKG: requested by surgeon. Pt is low risk Pulmonary:  No known disease Endocrine: no known disease Obesity:Body mass index is 19.95 kg/m. Chronic kidney disease:no known disease Chronic med that needs to be continued:none Anticoagulation: n/a     01/01/2023     8:43 AM 08/03/2020    1:35 PM  Depression screen PHQ 2/9  Decreased Interest 0 1  Down, Depressed, Hopeless 1 0  PHQ - 2 Score 1 1  Altered sleeping 1 1  Tired, decreased energy 0 1  Change in appetite 0 0  Feeling bad or failure about yourself  0 1  Trouble concentrating 1 0  Moving slowly or fidgety/restless 0 0  Suicidal thoughts 0 0  PHQ-9 Score 3 4  Difficult doing work/chores Not difficult at all       01/01/2023    8:43 AM 08/03/2020    1:35 PM  GAD 7 : Generalized Anxiety Score  Nervous, Anxious, on Edge 1 1  Control/stop worrying 1 1  Worry too much - different things 1 1  Trouble relaxing 0 0  Restless 0 0  Easily annoyed or irritable 0 1  Afraid - awful might happen 1 1  Total GAD 7 Score 4 5  Anxiety Difficulty Not difficult at all    Immunization History  Administered Date(s) Administered   Td 09/13/2004   Tdap 04/08/2015   Zoster Recombinat (Shingrix) 11/08/2021    Past Medical History:  Diagnosis Date   Anxiety    BREAST IMPLANTS, BILATERAL, HX OF 08/02/2007   Qualifier: Diagnosis of  By: Larose Kells MD, [place]    Chest discomfort 08/30/2020   Chest pain 08/05/2020   Chicken pox    Depression    Endocervical polyp    Right hip pain 11/12/2016   THYROID NODULE, HX OF 08/02/2007   Qualifier: Diagnosis of  By: Larose Kells MD, [place]  Vitamin D deficiency 11/2019   No Known Allergies Past Surgical History:  Procedure Laterality Date   BREAST ENHANCEMENT SURGERY     age 69   CESAREAN SECTION  2003   2003 and 2008   SEPTOPLASTY  2008   Family History  Problem Relation Age of Onset   Depression Mother    Anxiety disorder Mother    Hyperlipidemia Mother    Hypertension Mother    Alcohol abuse Father    Hyperlipidemia Father    Hypertension Father    Breast cancer Maternal Aunt 30   Breast cancer Maternal Aunt 70   Social History   Social History Narrative   Marital status/children/pets: Married with 2 children   Education/employment: Bachelor's  degree.  Works as an Glass blower/designer.   Safety:      -smoke alarm in the home:Yes     - wears seatbelt: Yes     - Feels safe in their relationships: Yes    Allergies as of 01/01/2023   No Known Allergies      Medication List        Accurate as of January 01, 2023  9:10 AM. If you have any questions, ask your nurse or doctor.          STOP taking these medications    MAGNEBIND 400 PO Stopped by: Howard Pouch, DO   Selenium 200 MCG Caps Stopped by: Howard Pouch, DO   VITAMIN C PO Stopped by: Howard Pouch, DO       TAKE these medications    Vitamin D 50 MCG (2000 UT) Caps Take 2,000 Units by mouth daily.        All past medical history, surgical history, allergies, family history, immunizations andmedications were updated in the EMR today and reviewed under the history and medication portions of their EMR.     No results found for this or any previous visit (from the past 2160 hour(s)).   ROS 14 pt review of systems performed and negative (unless mentioned in an HPI)  Objective: BP 105/74   Pulse 82   Temp 98 F (36.7 C)   Ht 5' 2.6" (1.59 m)   Wt 111 lb 3.2 oz (50.4 kg)   LMP 12/26/2022   SpO2 99%   BMI 19.95 kg/m  Physical Exam Vitals and nursing note reviewed.  Constitutional:      General: She is not in acute distress.    Appearance: Normal appearance. She is not ill-appearing or toxic-appearing.  HENT:     Head: Normocephalic and atraumatic.     Right Ear: Tympanic membrane, ear canal and external ear normal. There is no impacted cerumen.     Left Ear: Tympanic membrane, ear canal and external ear normal. There is no impacted cerumen.     Nose: No congestion or rhinorrhea.     Mouth/Throat:     Mouth: Mucous membranes are moist.     Pharynx: Oropharynx is clear. No oropharyngeal exudate or posterior oropharyngeal erythema.  Eyes:     General: No scleral icterus.       Right eye: No discharge.        Left eye: No discharge.      Extraocular Movements: Extraocular movements intact.     Conjunctiva/sclera: Conjunctivae normal.     Pupils: Pupils are equal, round, and reactive to light.  Cardiovascular:     Rate and Rhythm: Normal rate and regular rhythm.     Pulses: Normal pulses.     Heart  sounds: Normal heart sounds. No murmur heard.    No friction rub. No gallop.  Pulmonary:     Effort: Pulmonary effort is normal. No respiratory distress.     Breath sounds: Normal breath sounds. No stridor. No wheezing, rhonchi or rales.  Chest:     Chest wall: No tenderness.  Abdominal:     General: Abdomen is flat. Bowel sounds are normal. There is no distension.     Palpations: Abdomen is soft. There is no mass.     Tenderness: There is no abdominal tenderness. There is no right CVA tenderness, left CVA tenderness, guarding or rebound.     Hernia: No hernia is present.  Musculoskeletal:        General: No swelling, tenderness or deformity. Normal range of motion.     Cervical back: Normal range of motion and neck supple. No rigidity or tenderness.     Right lower leg: No edema.     Left lower leg: No edema.  Lymphadenopathy:     Cervical: No cervical adenopathy.  Skin:    General: Skin is warm and dry.     Coloration: Skin is not jaundiced or pale.     Findings: No bruising, erythema, lesion or rash.  Neurological:     General: No focal deficit present.     Mental Status: She is alert and oriented to person, place, and time. Mental status is at baseline.     Cranial Nerves: No cranial nerve deficit.     Sensory: No sensory deficit.     Motor: No weakness.     Coordination: Coordination normal.     Gait: Gait normal.     Deep Tendon Reflexes: Reflexes normal.  Psychiatric:        Mood and Affect: Mood normal.        Behavior: Behavior normal.        Thought Content: Thought content normal.        Judgment: Judgment normal.     EKG:NSR. HR 73, PR 162, QTC 390. No signs of ischemia changes.No changes from prior  EKG  No results found.  Assessment/plan: Dynesty Prisby is a 52 y.o. female present for CPE and pre-op clearance requiring labs and EKG for elective procedure Routine general medical examination at a health care facility Patient was encouraged to exercise greater than 150 minutes a week. Patient was encouraged to choose a diet filled with fresh fruits and vegetables, and lean meats. AVS provided to patient today for education/recommendation on gender specific health and safety maintenance. Colon cancer screen: no fhx.  Cologuard completed 11/07/2021.  Repeat in 3 years Mammogram: completed 04/2021.  High [employer] imaging. Will need to wait until surgery clears her after procedure. Cervical cancer screening: last pap: 07/2021,  GYN Immunizations: tdap 03/2015, Influenza declined (encouraged yearly), Shingrix x 1- declines second Infectious disease screening: HIV completed, Hep C declined DEXA: routine screen  - CBC with Differential/Platelet - Comprehensive metabolic panel - Hemoglobin A1c - Lipid panel - TSH  Lipid screening - Lipid panel Diabetes mellitus screening - Comprehensive metabolic panel - Hemoglobin A1c Screening for deficiency anemia - CBC with Differential/Platelet Thyroid nodule - TSH Preoperative clearance/ Encounter for completion of form with patient - form for preoperative clearance will be completed and faxed after results received. EKG:NSR. HR 73, PR 162, QTC 390. No signs of ischemia changes.No changes from prior EKG - APTT - Protime-INR No patient is free of risk when undergoing a procedure. The decision about whether  to proceed with the operation belongs to the surgeon and the patient.  Return in about 1 year (around 01/03/2024) for cpe (20 min).  Orders Placed This Encounter  Procedures   CBC with Differential/Platelet   Comprehensive metabolic panel   Hemoglobin A1c   Lipid panel   TSH   Protime-INR   APTT   EKG 12-Lead   No orders of the  defined types were placed in this encounter.  Referral Orders  No referral(s) requested today     Electronically signed by: Howard Pouch, [place]

## 2023-01-02 ENCOUNTER — Telehealth: Payer: Self-pay

## 2023-01-02 NOTE — Telephone Encounter (Signed)
Surgical clearance forms faxed and placed in brown folder for pick up. Pt informed

## 2023-06-06 ENCOUNTER — Encounter: Payer: Self-pay | Admitting: Obstetrics and Gynecology

## 2023-06-13 ENCOUNTER — Encounter: Payer: Self-pay | Admitting: Family Medicine

## 2023-06-18 ENCOUNTER — Ambulatory Visit (INDEPENDENT_AMBULATORY_CARE_PROVIDER_SITE_OTHER): Payer: Commercial Managed Care - PPO | Admitting: Family Medicine

## 2023-06-18 ENCOUNTER — Encounter: Payer: Self-pay | Admitting: Family Medicine

## 2023-06-18 VITALS — BP 124/78 | HR 88 | Temp 98.4°F | Wt 109.2 lb

## 2023-06-18 DIAGNOSIS — M5431 Sciatica, right side: Secondary | ICD-10-CM | POA: Diagnosis not present

## 2023-06-18 MED ORDER — METHOCARBAMOL 500 MG PO TABS: 500.0000 mg | ORAL_TABLET | Freq: Two times a day (BID) | ORAL | 0 refills | Status: DC | PRN

## 2023-06-18 MED ORDER — PREDNISONE 20 MG PO TABS: ORAL_TABLET | ORAL | 0 refills | Status: DC

## 2023-06-18 NOTE — Patient Instructions (Addendum)
Return in about 2 weeks (around 07/02/2023), or if symptoms worsen or fail to improve.        Great to see you today.  I have refilled the medication(s) we provide.   If labs were collected or images ordered, we will inform you of  results once we have received them and reviewed. We will contact you either by echart message, or telephone call.  Please give ample time to the testing facility, and our office to run,  receive and review results. Please do not call inquiring of results, even if you can see them in your chart. We will contact you as soon as we are able. If it has been over 1 week since the test was completed, and you have not yet heard from [place], then please call us.    - echart message- for normal results that have been seen by the patient already.   - telephone call: abnormal results or if patient has not viewed results in their echart.  If a referral to a specialist was entered for you, please call us in 2 weeks if you have not heard from the specialist office to schedule.

## 2023-06-18 NOTE — Progress Notes (Signed)
Jamie Bauer , Jun 05, 1971, 52 y.o., female MRN: [id number] Patient Care Team    Relationship Specialty Notifications Start End  Natalia Leatherwood, DO PCP - General Family Medicine  08/03/20   Donna Christen, [place]  Optometry  08/05/20   Graceann Congress, MD  Obstetrics and Gynecology  08/05/20     Chief Complaint  Patient presents with   Hip Pain    C/o right leg pain starting at hip going down outside of right leg; on and off for a few years.  Has been doing PT      Subjective: Jamie Bauer is a 52 y.o. Pt presents for an OV with complaints of right lateral leg pain of a few weeks duration.  She reports sciatica pain intermittently over 5 years.  Pt has tried nsaid to ease their symptoms.  Pt has been working with PT for sciatica pain. Today she reports  her Pt recommended a steroid taper. 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 She is wearing a mouth guard now.      01/01/2023    8:43 AM 08/03/2020    1:35 PM  Depression screen PHQ 2/9  Decreased Interest 0 1  Down, Depressed, Hopeless 1 0  PHQ - 2 Score 1 1  Altered sleeping 1 1  Tired, decreased energy 0 1  Change in appetite 0 0  Feeling bad or failure about yourself  0 1  Trouble concentrating 1 0  Moving slowly or fidgety/restless 0 0  Suicidal thoughts 0 0  PHQ-9 Score 3 4  Difficult doing work/chores Not difficult at all     No Known Allergies Social History   Social History Narrative   Marital status/children/pets: Married with 2 children   Education/employment: Bachelor's degree.  Works as an Print production planner.   Safety:      -smoke alarm in the home:Yes     - wears seatbelt: Yes     - Feels safe in their relationships: Yes   Past Medical History:  Diagnosis Date   Anxiety    BREAST IMPLANTS, BILATERAL, HX OF 08/02/2007   Qualifier: Diagnosis of  By: Drue Novel MD, Nolon Rod.    Chest discomfort 08/30/2020   Chest pain 08/05/2020   Chicken pox    Depression     Endocervical polyp    Right hip pain 11/12/2016   THYROID NODULE, HX OF 08/02/2007   Qualifier: Diagnosis of  By: Drue Novel MD, Nolon Rod    Vitamin D deficiency 11/2019   Past Surgical History:  Procedure Laterality Date   BREAST ENHANCEMENT SURGERY     age 73   CESAREAN SECTION  2003   2003 and 2008   SEPTOPLASTY  2008   Family History  Problem Relation Age of Onset   Depression Mother    Anxiety disorder Mother    Hyperlipidemia Mother    Hypertension Mother    Alcohol abuse Father    Hyperlipidemia Father    Hypertension Father    Breast cancer Maternal Aunt 30   Breast cancer Maternal Aunt 41   Allergies as of 06/18/2023   No Known Allergies      Medication List        Accurate as of June 18, 2023  1:42 PM. If you have any questions, ask your nurse or doctor.          Vitamin D 50 MCG (2000 UT) Caps Take 2,000 Units  by mouth daily.        All past medical history, surgical history, allergies, family history, immunizations andmedications were updated in the EMR today and reviewed under the history and medication portions of their EMR.     ROS Negative, with the exception of above mentioned in HPI   Objective:  BP 124/78   Pulse 88   Temp 98.4 F (36.9 C)   Wt 109 lb 3.2 oz (49.5 kg)   LMP 06/03/2023   SpO2 98%   BMI 19.59 kg/m  Body mass index is 19.59 kg/m.  Physical Exam Vitals and nursing note reviewed.  Constitutional:      General: She is not in acute distress.    Appearance: Normal appearance. She is normal weight. She is not ill-appearing or toxic-appearing.  HENT:     Head: Normocephalic and atraumatic.  Eyes:     General: No scleral icterus.       Right eye: No discharge.        Left eye: No discharge.     Extraocular Movements: Extraocular movements intact.     Conjunctiva/sclera: Conjunctivae normal.     Pupils: Pupils are equal, round, and reactive to light.  Musculoskeletal:        General: Normal range of motion.      Comments: No bone tenderness lumbar spine. TTP piriformis area right. No SI pain.  MS 5+ b/l LE. Neg SLR. NV intact distally  Skin:    Findings: No rash.  Neurological:     Mental Status: She is alert and oriented to person, place, and time. Mental status is at baseline.     Motor: No weakness.     Coordination: Coordination normal.     Gait: Gait normal.  Psychiatric:        Mood and Affect: Mood normal.        Behavior: Behavior normal.        Thought Content: Thought content normal.        Judgment: Judgment normal.      No results found. No results found. No results found for this or any previous visit (from the past 24 hour(s)).  Assessment/Plan: Jamie Bauer is a 52 y.o. female present for OV for  Sciatica of right side Heat. Stretches.  Continue PT Prednisone taper and robaxin prescribed.  F/u PRN  Reviewed expectations re: course of current medical issues. Discussed self-management of symptoms. Outlined signs and symptoms indicating need for more acute intervention. Patient verbalized understanding and all questions were answered. Patient received an After-Visit Summary.    No orders of the defined types were placed in this encounter.  No orders of the defined types were placed in this encounter.  Referral Orders  No referral(s) requested today     Note is dictated utilizing voice recognition software. Although note has been proof read prior to signing, occasional typographical errors still can be missed. If any questions arise, please do not hesitate to call for verification.   electronically signed by:  Felix Pacini, DO  [facility] [place]

## 2023-06-19 ENCOUNTER — Other Ambulatory Visit: Payer: Self-pay | Admitting: Family Medicine

## 2023-06-19 DIAGNOSIS — Z1231 Encounter for screening mammogram for malignant neoplasm of breast: Secondary | ICD-10-CM

## 2024-03-19 LAB — HM MAMMOGRAPHY

## 2024-10-15 ENCOUNTER — Encounter: Payer: Self-pay | Admitting: Family Medicine

## 2024-10-15 ENCOUNTER — Ambulatory Visit (INDEPENDENT_AMBULATORY_CARE_PROVIDER_SITE_OTHER): Admitting: Family Medicine

## 2024-10-15 VITALS — BP 120/74 | HR 73 | Temp 98.1°F | Ht 63.0 in | Wt 108.8 lb

## 2024-10-15 DIAGNOSIS — Z1231 Encounter for screening mammogram for malignant neoplasm of breast: Secondary | ICD-10-CM | POA: Diagnosis not present

## 2024-10-15 DIAGNOSIS — Z13220 Encounter for screening for lipoid disorders: Secondary | ICD-10-CM

## 2024-10-15 DIAGNOSIS — Z0000 Encounter for general adult medical examination without abnormal findings: Secondary | ICD-10-CM | POA: Diagnosis not present

## 2024-10-15 DIAGNOSIS — Z1211 Encounter for screening for malignant neoplasm of colon: Secondary | ICD-10-CM

## 2024-10-15 DIAGNOSIS — Z23 Encounter for immunization: Secondary | ICD-10-CM | POA: Diagnosis not present

## 2024-10-15 DIAGNOSIS — Z131 Encounter for screening for diabetes mellitus: Secondary | ICD-10-CM | POA: Diagnosis not present

## 2024-10-15 LAB — COMPREHENSIVE METABOLIC PANEL WITH GFR
ALT: 9 U/L (ref 0–35)
AST: 13 U/L (ref 0–37)
Albumin: 4.5 g/dL (ref 3.5–5.2)
Alkaline Phosphatase: 59 U/L (ref 39–117)
BUN: 12 mg/dL (ref 6–23)
CO2: 26 meq/L (ref 19–32)
Calcium: 9.5 mg/dL (ref 8.4–10.5)
Chloride: 104 meq/L (ref 96–112)
Creatinine, Ser: 0.59 mg/dL (ref 0.40–1.20)
GFR: 102.6 mL/min (ref >60.00)
Glucose, Bld: 87 mg/dL (ref 70–99)
Potassium: 4.1 meq/L (ref 3.5–5.1)
Sodium: 137 meq/L (ref 135–145)
Total Bilirubin: 0.6 mg/dL (ref 0.2–1.2)
Total Protein: 7.1 g/dL (ref 6.0–8.3)

## 2024-10-15 LAB — CBC
HCT: 36.9 % (ref 36.0–46.0)
Hemoglobin: 12.8 g/dL (ref 12.0–15.0)
MCHC: 34.7 g/dL (ref 30.0–36.0)
MCV: 92.5 fl (ref 78.0–100.0)
Platelets: 282 K/uL (ref 150.0–400.0)
RBC: 3.98 Mil/uL (ref 3.87–5.11)
RDW: 12.9 % (ref 11.5–15.5)
WBC: 5.4 K/uL (ref 4.0–10.5)

## 2024-10-15 LAB — LIPID PANEL
Cholesterol: 191 mg/dL (ref 0–200)
HDL: 65.7 mg/dL (ref >39.00)
LDL Cholesterol: 115 mg/dL — ABNORMAL HIGH (ref 0–99)
NonHDL: 124.99
Total CHOL/HDL Ratio: 3
Triglycerides: 52 mg/dL (ref 0.0–149.0)
VLDL: 10.4 mg/dL (ref 0.0–40.0)

## 2024-10-15 LAB — HEMOGLOBIN A1C: Hgb A1c MFr Bld: 4.4 % — ABNORMAL LOW (ref 4.6–6.5)

## 2024-10-15 LAB — TSH: TSH: 3.22 u[IU]/mL (ref 0.35–5.50)

## 2024-10-15 NOTE — Progress Notes (Signed)
 Patient ID: Jamie Bauer, female  DOB: [birth year], 53 y.o.   MRN: [id number] Patient Care Team    Relationship Specialty Notifications Start End  Catherine Charlies LABOR, DO PCP - General Family Medicine  08/03/20   Raelyn Harlene DEL, [place]  Optometry  08/05/20   Shoshana Clotilda BRAVO, MD  Obstetrics and Gynecology  08/05/20     Chief Complaint  Patient presents with   Annual Exam    Pt is fasting.     Subjective: Jamie Bauer is a 53 y.o.  Female  present for CPE  All past medical history, surgical history, allergies, family history, immunizations, medications and social history were update in the electronic medical record today. All recent labs, ED visits and hospitalizations within the last year were reviewed.  Health maintenance:  Colon cancer screen: no fhx.  Cologuard completed 11/07/2021.  Repeat in 3 years Mammogram: completed 03/19/2024.  [place] [facility]- wants MRI for dense breast. She will be paying out of pocket - she will send us  info on location ([facility] OR dri) Cervical cancer screening: last pap: 12/11/2023,  GYN Immunizations: tdap - declined today- due 03/2025, Influenza declined (encouraged yearly), Shingrix x 1- declines second, prevnar declined Infectious disease screening: HIV completed, Hep C declined DEXA: routine screen  Patient has a Dental home. Hospitalizations/ED visits: Reviewed     10/15/2024    8:55 AM 06/18/2023    1:42 PM 01/01/2023    8:43 AM 08/03/2020    1:35 PM  Depression screen PHQ 2/9  Decreased Interest 2 0 0 1  Down, Depressed, Hopeless 1 0 1 0  PHQ - 2 Score 3 0 1 1  Altered sleeping 2  1 1   Tired, decreased energy 0  0 1  Change in appetite 0  0 0  Feeling bad or failure about yourself  0  0 1  Trouble concentrating 1  1 0  Moving slowly or fidgety/restless 0  0 0  Suicidal thoughts 0  0 0  PHQ-9 Score 6  3  4    Difficult doing work/chores Somewhat difficult  Not difficult at all      Data saved with a previous flowsheet row  definition      10/15/2024    8:55 AM 01/01/2023    8:43 AM 08/03/2020    1:35 PM  GAD 7 : Generalized Anxiety Score  Nervous, Anxious, on Edge 3 1 1   Control/stop worrying 3 1 1   Worry too much - different things 3 1 1   Trouble relaxing 2 0 0  Restless 0 0 0  Easily annoyed or irritable 1 0 1  Afraid - awful might happen 1 1 1   Total GAD 7 Score 13 4 5   Anxiety Difficulty Somewhat difficult Not difficult at all       01/01/2023    8:42 AM 06/18/2023    1:42 PM 10/15/2024    8:55 AM  Fall Risk  Falls in the past year? 0 0 0  Was there an injury with Fall? 0  0  0  Fall Risk Category Calculator  0 0  Patient at Risk for Falls Due to  No Fall Risks   Fall risk Follow up Falls evaluation completed Falls evaluation completed Falls evaluation completed     Data saved with a previous flowsheet row definition    Immunization History  Administered Date(s) Administered   Td 09/13/2004   Tdap 04/08/2015   Zoster Recombinant(Shingrix) 11/08/2021    Past  Medical History:  Diagnosis Date   Anxiety    BREAST IMPLANTS, BILATERAL, HX OF 08/02/2007   Qualifier: Diagnosis of  By: Amon MD, Aloysius BRAVO.    Chest discomfort 08/30/2020   Chest pain 08/05/2020   Chicken pox    Depression    Endocervical polyp    Foot mass, left 10/31/2021   Right hip pain 11/12/2016   THYROID  NODULE, HX OF 08/02/2007   Qualifier: Diagnosis of  By: Amon MD, Aloysius BRAVO.    Vitamin D deficiency 11/2019   No Known Allergies Past Surgical History:  Procedure Laterality Date   BREAST ENHANCEMENT SURGERY     age 68   CESAREAN SECTION  2003   2003 and 2008   SEPTOPLASTY  2008   Family History  Problem Relation Age of Onset   Depression Mother    Anxiety disorder Mother    Hyperlipidemia Mother    Hypertension Mother    Alcohol abuse Father    Hyperlipidemia Father    Hypertension Father    Lymphoma Father    Breast cancer Maternal Aunt 30   Breast cancer Maternal Aunt 63   Social History   Social  History Narrative   Marital status/children/pets: Married with 2 children   Education/employment: Bachelor's degree.  Works as an print production planner.   Safety:      -smoke alarm in the home:Yes     - wears seatbelt: Yes     - Feels safe in their relationships: Yes    Allergies as of 10/15/2024   No Known Allergies      Medication List        Accurate as of October 15, 2024  2:42 PM. If you have any questions, ask your nurse or doctor.          STOP taking these medications    methocarbamol  500 MG tablet Commonly known as: ROBAXIN  Stopped by: Charlies Bellini   predniSONE  20 MG tablet Commonly known as: DELTASONE  Stopped by: Charlies Bellini       TAKE these medications    Vitamin D 50 MCG (2000 UT) Caps Take 2,000 Units by mouth daily.        All past medical history, surgical history, allergies, family history, immunizations andmedications were updated in the EMR today and reviewed under the history and medication portions of their EMR.     No results found for this or any previous visit (from the past 2160 hours).   Review of Systems  Constitutional: Negative.   HENT: Negative.    Eyes: Negative.   Respiratory: Negative.    Cardiovascular: Negative.   Gastrointestinal: Negative.   Genitourinary: Negative.   Musculoskeletal: Negative.   Skin: Negative.   Neurological: Negative.   Endo/Heme/Allergies: Negative.   Psychiatric/Behavioral: Negative.    All other systems reviewed and are negative.  14 pt review of systems performed and negative (unless mentioned in an HPI)  Objective: BP 120/74   Pulse 73   Temp 98.1 F (36.7 C)   Ht 5' 3 (1.6 m)   Wt 108 lb 12.8 oz (49.4 kg)   LMP 07/14/2024   SpO2 96%   BMI 19.27 kg/m  Physical Exam Vitals and nursing note reviewed.  Constitutional:      General: She is not in acute distress.    Appearance: Normal appearance. She is not ill-appearing or toxic-appearing.  HENT:     Head: Normocephalic and  atraumatic.     Right Ear: Tympanic membrane, ear canal and external  ear normal. There is no impacted cerumen.     Left Ear: Tympanic membrane, ear canal and external ear normal. There is no impacted cerumen.     Nose: No congestion or rhinorrhea.     Mouth/Throat:     Mouth: Mucous membranes are moist.     Pharynx: Oropharynx is clear. No oropharyngeal exudate or posterior oropharyngeal erythema.  Eyes:     General: No scleral icterus.       Right eye: No discharge.        Left eye: No discharge.     Extraocular Movements: Extraocular movements intact.     Conjunctiva/sclera: Conjunctivae normal.     Pupils: Pupils are equal, round, and reactive to light.  Cardiovascular:     Rate and Rhythm: Normal rate and regular rhythm.     Pulses: Normal pulses.     Heart sounds: Normal heart sounds. No murmur heard.    No friction rub. No gallop.  Pulmonary:     Effort: Pulmonary effort is normal. No respiratory distress.     Breath sounds: Normal breath sounds. No stridor. No wheezing, rhonchi or rales.  Chest:     Chest wall: No tenderness.  Abdominal:     General: Abdomen is flat. Bowel sounds are normal. There is no distension.     Palpations: Abdomen is soft. There is no mass.     Tenderness: There is no abdominal tenderness. There is no right CVA tenderness, left CVA tenderness, guarding or rebound.     Hernia: No hernia is present.  Musculoskeletal:        General: No swelling, tenderness or deformity. Normal range of motion.     Cervical back: Normal range of motion and neck supple. No rigidity or tenderness.     Right lower leg: No edema.     Left lower leg: No edema.  Lymphadenopathy:     Cervical: No cervical adenopathy.  Skin:    General: Skin is warm and dry.     Coloration: Skin is not jaundiced or pale.     Findings: No bruising, erythema, lesion or rash.  Neurological:     General: No focal deficit present.     Mental Status: She is alert and oriented to person, place,  and time. Mental status is at baseline.     Cranial Nerves: No cranial nerve deficit.     Sensory: No sensory deficit.     Motor: No weakness.     Coordination: Coordination normal.     Gait: Gait normal.     Deep Tendon Reflexes: Reflexes normal.  Psychiatric:        Mood and Affect: Mood normal.        Behavior: Behavior normal.        Thought Content: Thought content normal.        Judgment: Judgment normal.       No results found.  Assessment/plan: Ambre Kobayashi is a 53 y.o. female present for CPE and pre-op clearance requiring labs and EKG for elective procedure Routine general medical examination at a health care facility (Primary) Patient was encouraged to exercise greater than 150 minutes a week. Patient was encouraged to choose a diet filled with fresh fruits and vegetables, and lean meats. AVS provided to patient today for education/recommendation on gender specific health and safety maintenance. Colon cancer screen: no fhx.  Cologuard completed 11/07/2021.  Repeat in 3 years Mammogram: completed 03/19/2024.  [facility]- wants MRI for dense breast.  She will be paying out of pocket - she will send us  info on location ([facility] OR dri) Cervical cancer screening: last pap: 12/11/2023,  GYN Immunizations: tdap - declined today- due 03/2025, Influenza declined (encouraged yearly), Shingrix x 1- declines second, prevnar declined Infectious disease screening: HIV completed, Hep C declined DEXA: routine screen   Need for Tdap vaccination Declined Need for vaccination for pneumococcus Declined  Breast cancer screening by mammogram Desires MRI-she will be paying out-of-pocket.  She will contact us  with the referral location information Lipid screening - Hemoglobin A1c Diabetes mellitus screening - Lipid panel Colon cancer screening - Cologuard   Return in about 1 year (around 10/16/2025) for cpe (20 min).  Orders Placed This Encounter  Procedures    CBC   Comprehensive metabolic panel with GFR   Hemoglobin A1c   Lipid panel   TSH   Cologuard   No orders of the defined types were placed in this encounter.  Referral Orders  No referral(s) requested today     Electronically signed by: Charlies Bellini, DO [facility]

## 2024-10-15 NOTE — Patient Instructions (Addendum)

## 2024-10-16 ENCOUNTER — Ambulatory Visit: Payer: Self-pay | Admitting: Family Medicine

## 2024-10-28 ENCOUNTER — Encounter: Payer: Self-pay | Admitting: Family Medicine

## 2024-10-28 DIAGNOSIS — Z1239 Encounter for other screening for malignant neoplasm of breast: Secondary | ICD-10-CM

## 2024-11-10 LAB — COLOGUARD: COLOGUARD: NEGATIVE

## 2025-04-09 ENCOUNTER — Ambulatory Visit
